# Patient Record
Sex: Male | Born: 1985 | Race: White | Hispanic: No | Marital: Single | Smoking: Current every day smoker
Health system: Southern US, Community
[De-identification: ages and names within clinical notes are randomized; demographics above are authoritative.]

## PROBLEM LIST (undated history)

## (undated) HISTORY — PX: TONSILLECTOMY: SUR1361

## (undated) HISTORY — PX: CYST REMOVAL NECK: SHX6281

---

## 2008-04-24 ENCOUNTER — Emergency Department: Payer: Self-pay | Admitting: Emergency Medicine

## 2008-05-20 ENCOUNTER — Emergency Department: Payer: Self-pay | Admitting: Unknown Physician Specialty

## 2011-11-25 ENCOUNTER — Emergency Department: Payer: Self-pay | Admitting: *Deleted

## 2013-05-23 ENCOUNTER — Emergency Department: Payer: Self-pay | Admitting: Emergency Medicine

## 2013-08-02 ENCOUNTER — Emergency Department: Payer: Self-pay | Admitting: Emergency Medicine

## 2013-11-23 ENCOUNTER — Emergency Department: Payer: Self-pay | Admitting: Emergency Medicine

## 2013-12-22 ENCOUNTER — Emergency Department: Payer: Self-pay | Admitting: Emergency Medicine

## 2014-01-19 ENCOUNTER — Emergency Department: Payer: Self-pay | Admitting: Emergency Medicine

## 2014-01-19 LAB — URINALYSIS, COMPLETE
Bacteria: NONE SEEN
Bilirubin,UR: NEGATIVE
Blood: NEGATIVE
Glucose,UR: NEGATIVE mg/dL (ref 0–75)
Ketone: NEGATIVE
Leukocyte Esterase: NEGATIVE
NITRITE: NEGATIVE
PROTEIN: NEGATIVE
Ph: 6 (ref 4.5–8.0)
RBC,UR: NONE SEEN /HPF (ref 0–5)
Specific Gravity: 1.018 (ref 1.003–1.030)
Squamous Epithelial: <1

## 2014-01-19 LAB — CBC WITH DIFFERENTIAL/PLATELET
Basophil #: 0.1 10*3/uL (ref 0.0–0.1)
Basophil %: 0.7 %
EOS ABS: 0.3 10*3/uL (ref 0.0–0.7)
Eosinophil %: 3.5 %
HCT: 45.3 % (ref 40.0–52.0)
HGB: 15 g/dL (ref 13.0–18.0)
Lymphocyte #: 2.5 10*3/uL (ref 1.0–3.6)
Lymphocyte %: 28.6 %
MCH: 29.9 pg (ref 26.0–34.0)
MCHC: 33.2 g/dL (ref 32.0–36.0)
MCV: 90 fL (ref 80–100)
Monocyte #: 0.6 x10 3/mm (ref 0.2–1.0)
Monocyte %: 6.4 %
Neutrophil #: 5.3 10*3/uL (ref 1.4–6.5)
Neutrophil %: 60.8 %
PLATELETS: 221 10*3/uL (ref 150–440)
RBC: 5.03 10*6/uL (ref 4.40–5.90)
RDW: 13.4 % (ref 11.5–14.5)
WBC: 8.8 10*3/uL (ref 3.8–10.6)

## 2014-01-19 LAB — COMPREHENSIVE METABOLIC PANEL
ALBUMIN: 4.4 g/dL (ref 3.4–5.0)
ANION GAP: 1 — AB (ref 7–16)
AST: 46 U/L — AB (ref 15–37)
Alkaline Phosphatase: 78 U/L
BUN: 7 mg/dL (ref 7–18)
Bilirubin,Total: 0.4 mg/dL (ref 0.2–1.0)
CHLORIDE: 108 mmol/L — AB (ref 98–107)
CO2: 30 mmol/L (ref 21–32)
Calcium, Total: 9.4 mg/dL (ref 8.5–10.1)
Creatinine: 1.13 mg/dL (ref 0.60–1.30)
EGFR (Non-African Amer.): >60
Glucose: 112 mg/dL — ABNORMAL HIGH (ref 65–99)
OSMOLALITY: 276 (ref 275–301)
Potassium: 3.7 mmol/L (ref 3.5–5.1)
SGPT (ALT): 69 U/L (ref 12–78)
Sodium: 139 mmol/L (ref 136–145)
TOTAL PROTEIN: 7.5 g/dL (ref 6.4–8.2)

## 2014-01-19 LAB — LIPASE, BLOOD: LIPASE: 684 U/L — AB (ref 73–393)

## 2014-01-26 ENCOUNTER — Emergency Department: Payer: Self-pay | Admitting: Emergency Medicine

## 2014-06-04 ENCOUNTER — Emergency Department: Payer: Self-pay | Admitting: Emergency Medicine

## 2014-06-19 ENCOUNTER — Emergency Department: Payer: Self-pay

## 2014-12-30 ENCOUNTER — Emergency Department: Payer: Self-pay | Admitting: Student

## 2014-12-30 LAB — DRUG SCREEN, URINE
AMPHETAMINES, UR SCREEN: NEGATIVE (ref ?–1000)
Barbiturates, Ur Screen: NEGATIVE (ref ?–200)
Benzodiazepine, Ur Scrn: POSITIVE (ref ?–200)
CANNABINOID 50 NG, UR ~~LOC~~: NEGATIVE (ref ?–50)
Cocaine Metabolite,Ur ~~LOC~~: POSITIVE (ref ?–300)
MDMA (Ecstasy)Ur Screen: NEGATIVE (ref ?–500)
METHADONE, UR SCREEN: NEGATIVE (ref ?–300)
OPIATE, UR SCREEN: NEGATIVE (ref ?–300)
PHENCYCLIDINE (PCP) UR S: NEGATIVE (ref ?–25)
Tricyclic, Ur Screen: NEGATIVE (ref ?–1000)

## 2014-12-30 LAB — URINALYSIS, COMPLETE
BLOOD: NEGATIVE
Bacteria: NONE SEEN
Bilirubin,UR: NEGATIVE
GLUCOSE, UR: NEGATIVE mg/dL (ref 0–75)
Ketone: NEGATIVE
Leukocyte Esterase: NEGATIVE
Nitrite: NEGATIVE
Ph: 6 (ref 4.5–8.0)
Protein: NEGATIVE
RBC,UR: 1 /HPF (ref 0–5)
SPECIFIC GRAVITY: 1.015 (ref 1.003–1.030)
Squamous Epithelial: <1
Transitional Epi: 4

## 2015-06-15 ENCOUNTER — Encounter: Payer: Self-pay | Admitting: Emergency Medicine

## 2015-06-15 ENCOUNTER — Emergency Department: Payer: Self-pay

## 2015-06-15 ENCOUNTER — Emergency Department
Admission: EM | Admit: 2015-06-15 | Discharge: 2015-06-15 | Disposition: A | Discharge status: Home / Self Care | Payer: Self-pay | Attending: Emergency Medicine | Admitting: Emergency Medicine

## 2015-06-15 DIAGNOSIS — Z720 Tobacco use: Secondary | ICD-10-CM | POA: Insufficient documentation

## 2015-06-15 DIAGNOSIS — N50811 Right testicular pain: Secondary | ICD-10-CM

## 2015-06-15 DIAGNOSIS — N433 Hydrocele, unspecified: Secondary | ICD-10-CM

## 2015-06-15 LAB — BASIC METABOLIC PANEL
Anion gap: 5 (ref 5–15)
BUN: 7 mg/dL (ref 6–20)
CALCIUM: 9.6 mg/dL (ref 8.9–10.3)
CO2: 28 mmol/L (ref 22–32)
Chloride: 108 mmol/L (ref 101–111)
Creatinine, Ser: 1.18 mg/dL (ref 0.61–1.24)
Glucose, Bld: 101 mg/dL — ABNORMAL HIGH (ref 65–99)
Potassium: 4.1 mmol/L (ref 3.5–5.1)
Sodium: 141 mmol/L (ref 135–145)

## 2015-06-15 LAB — CBC WITH DIFFERENTIAL/PLATELET
BASOS PCT: 2 %
Basophils Absolute: 0.2 10*3/uL — ABNORMAL HIGH (ref 0–0.1)
Eosinophils Absolute: 0.2 10*3/uL (ref 0–0.7)
Eosinophils Relative: 2 %
HCT: 44.5 % (ref 40.0–52.0)
Hemoglobin: 15.2 g/dL (ref 13.0–18.0)
LYMPHS ABS: 2 10*3/uL (ref 1.0–3.6)
Lymphocytes Relative: 24 %
MCH: 30.1 pg (ref 26.0–34.0)
MCHC: 34.1 g/dL (ref 32.0–36.0)
MCV: 88.1 fL (ref 80.0–100.0)
MONO ABS: 0.4 10*3/uL (ref 0.2–1.0)
Monocytes Relative: 5 %
Neutro Abs: 5.6 10*3/uL (ref 1.4–6.5)
Neutrophils Relative %: 67 %
Platelets: 192 10*3/uL (ref 150–440)
RBC: 5.05 MIL/uL (ref 4.40–5.90)
RDW: 13.5 % (ref 11.5–14.5)
WBC: 8.3 10*3/uL (ref 3.8–10.6)

## 2015-06-15 LAB — URINALYSIS COMPLETE WITH MICROSCOPIC (ARMC ONLY)
BACTERIA UA: NONE SEEN
Bilirubin Urine: NEGATIVE
Glucose, UA: NEGATIVE mg/dL
Ketones, ur: NEGATIVE mg/dL
Leukocytes, UA: NEGATIVE
NITRITE: NEGATIVE
Protein, ur: NEGATIVE mg/dL
SPECIFIC GRAVITY, URINE: 1.005 (ref 1.005–1.030)
SQUAMOUS EPITHELIAL / LPF: NONE SEEN
pH: 6 (ref 5.0–8.0)

## 2015-06-15 LAB — CHLAMYDIA/NGC RT PCR (ARMC ONLY)
Chlamydia Tr: NOT DETECTED
N GONORRHOEAE: NOT DETECTED

## 2015-06-15 MED ORDER — SODIUM CHLORIDE 0.9 % IV BOLUS (SEPSIS)
1000.0000 mL | Freq: Once | INTRAVENOUS | Status: AC
  Administered 2015-06-15: 1000 mL via INTRAVENOUS

## 2015-06-15 NOTE — ED Provider Notes (Signed)
[facility] Emergency Department Provider Note    ____________________________________________  Time seen: 1520  I have reviewed the triage vital signs and the nursing notes.   HISTORY  Chief Complaint Testicle Pain   History limited by: Not Limited   HPI Scott George is a 29 y.o. male who presents to the emergency department today because of concerns of right testicular pain. Patient states pain is been going on for roughly 2 weeks. He has intermittent episodes of sharp pain that is then turned into dull pain. He states that this dull pain than stays constant. He has had some associated nausea. He states he has not had any burning with his urination however does state he has noticed a foul odor to his urine. No discharge. He has not noticed any swelling to his testicles. No fevers.   History reviewed. No pertinent past medical history.  There are no active problems to display for this patient.   Past Surgical History  Procedure Laterality Date  . Cyst removal neck    . Tonsillectomy      No current outpatient prescriptions on file.  Allergies Review of patient's allergies indicates no known allergies.  No family history on file.  Social History History  Substance Use Topics  . Smoking status: Current Every Day Smoker -- 0.50 packs/day    Types: Cigarettes  . Smokeless tobacco: Not on file  . Alcohol Use: No    Review of Systems  Constitutional: Negative for fever. Cardiovascular: Negative for chest pain. Respiratory: Negative for shortness of breath. Gastrointestinal: Negative for abdominal pain, vomiting and diarrhea. Genitourinary: Negative for dysuria. Foul odor to his urine Musculoskeletal: Negative for back pain. Skin: Negative for rash. Neurological: Negative for headaches, focal weakness or numbness.   10-point ROS otherwise negative.  ____________________________________________   PHYSICAL EXAM:  VITAL  SIGNS: ED Triage Vitals  Enc Vitals Group     BP 06/15/15 1453 135/93 mmHg     Pulse Rate 06/15/15 1453 136     Resp 06/15/15 1453 20     Temp 06/15/15 1453 98.6 F (37 C)     Temp Source 06/15/15 1453 Oral     SpO2 06/15/15 1453 97 %     Weight 06/15/15 1453 200 lb (90.719 kg)     Height 06/15/15 1453 6\' 4"  (1.93 m)     Head Cir --      Peak Flow --      Pain Score 06/15/15 1453 8   Constitutional: Alert and oriented. Well appearing and in no distress. Eyes: Conjunctivae are normal. PERRL. Normal extraocular movements. ENT   Head: Normocephalic and atraumatic.   Nose: No congestion/rhinnorhea.   Mouth/Throat: Mucous membranes are moist.   Neck: No stridor. Hematological/Lymphatic/Immunilogical: No cervical lymphadenopathy. Cardiovascular: Normal rate, regular rhythm.  No murmurs, rubs, or gallops. Respiratory: Normal respiratory effort without tachypnea nor retractions. Breath sounds are clear and equal bilaterally. No wheezes/rales/rhonchi. Gastrointestinal: Soft and nontender. No distention. There is no CVA tenderness. Genitourinary: No swelling to bilateral testicles. Mild tenderness to palpation of the right testicle. Cremasteric intact. No penile swelling, lesions. Musculoskeletal: Normal range of motion in all extremities. No joint effusions.  No lower extremity tenderness nor edema. Neurologic:  Normal speech and language. No gross focal neurologic deficits are appreciated. Speech is normal.  Skin:  Skin is warm, dry and intact. No rash noted. Psychiatric: Mood and affect are normal. Speech and behavior are normal. Patient exhibits appropriate insight and judgment.  ____________________________________________  LABS (pertinent positives/negatives)  Labs Reviewed  URINALYSIS COMPLETEWITH MICROSCOPIC ([facility] ONLY) - Abnormal; Notable for the following:    Color, Urine STRAW (*)    APPearance CLEAR (*)    Hgb urine dipstick 2+ (*)    All other components  within normal limits  CBC WITH DIFFERENTIAL/PLATELET - Abnormal; Notable for the following:    Basophils Absolute 0.2 (*)    All other components within normal limits  BASIC METABOLIC PANEL - Abnormal; Notable for the following:    Glucose, Bld 101 (*)    All other components within normal limits  CHLAMYDIA/NGC RT PCR ([facility] ONLY)  HIV ANTIBODY (ROUTINE TESTING)    ____________________________________________   EKG  None  ____________________________________________    RADIOLOGY  US scrotal IMPRESSION: Small BILATERAL hydroceles.  Otherwise normal exam.  ____________________________________________   PROCEDURES  Procedure(s) performed: None  Critical Care performed: No  ____________________________________________   INITIAL IMPRESSION / ASSESSMENT AND PLAN / ED COURSE  Pertinent labs & imaging results that were available during my care of the patient were reviewed by me and considered in my medical decision making (see chart for details).  Patient presents to the emergency department with right testicular pain. Ultrasound showed good flow to bilateral testicles. Small hydroceles. Blood work without any leukocytosis. No obvious cause of etiology pain based either on workup or physical exam. Did discuss with patient hydroceles. Will give patient urology follow-up.  ____________________________________________   FINAL CLINICAL IMPRESSION(S) / ED DIAGNOSES  Final diagnoses:  Right testicular pain  Hydrocele, unspecified hydrocele type     Phineas Semen, MD 06/15/15 2044

## 2015-06-15 NOTE — ED Notes (Signed)
Pt reports testicle and lower pelvis pain x1-2 weeks; pt denies any discharge from penis, denies difficulty urinating. Pt reports odor to urine.

## 2015-06-15 NOTE — Discharge Instructions (Signed)
Please seek medical attention for any high fevers, chest pain, shortness of breath, change in behavior, persistent vomiting, bloody stool or any other new or concerning symptoms. ° °Hydrocele, Adult °Fluid can collect around the testicles. This fluid forms in a sac. This condition is called a hydrocele. The collected fluid causes swelling of the scrotum. Usually, it affects just one testicle. Most of the time, the condition does not cause pain. Sometimes, the hydrocele goes away on its own. Other times, surgery is needed to get rid of the fluid. °CAUSES °A hydrocele does not develop often. Different things can cause a hydrocele in a man, including: °· Injury to the scrotum. °· Infection. °· X-ray of the area around the scrotum. °· A tumor or cancer of the testicle. °· Twisting of a testicle. °· Decreased blood flow to the scrotum. °SYMPTOMS  °· Swelling without pain. The hydrocele feels like a water-filled balloon. °· Swelling with pain. This can occur if the hydrocele was caused by infection or twisting. °· Mild discomfort in the scrotum. °· The hydrocele may feel heavy. °· Swelling that gets smaller when you lie down. °DIAGNOSIS  °Your caregiver will do a physical exam to decide if you have a hydrocele. This may include: °· Asking questions about your overall health, today and in the past. Your caregiver may ask about any injuries, X-rays, or infections. °· Pushing on your abdomen or asking you to change positions to see if the size of the hydrocele changes. °· Shining a light through the scrotum (transillumination) to see if the fluid inside the scrotum is clear. °· Blood tests and urine tests to check for infection. °· Imaging studies that take pictures of the scrotum and testicles. °TREATMENT  °Treatment depends in part on what caused the condition. Options include: °· Watchful waiting. Your caregiver checks the hydrocele every so often. °· Different surgeries to drain the fluid. °¨ A needle may be put into the  scrotum to drain fluid (needle aspiration). Fluid often returns after this type of treatment. °¨ A cut (incision) may be made in the scrotum to remove the fluid sac (hydrocelectomy). °¨ An incision may be made in the groin to repair a hydrocele that has contact with abdominal fluids (communicating hydrocele). °· Medicines to treat an infection (antibiotics). °HOME CARE INSTRUCTIONS  °What you need to do at home may depend on the cause of the hydrocele and type of treatment. In general: °· Take all medicine as directed by your caregiver. Follow the directions carefully. °· Ask your caregiver if there is anything you should not do while you recover (activities, lifting, work, sex). °· If you had surgery to repair a communicating hydrocele, recovery time may vary. Ask you caregiver about your recovery time. °· Avoid heavy lifting for 4 to 6 weeks. °· If you had an incision on the scrotum or groin, wash it for 2 to 3 days after surgery. Do this as long as the skin is closed and there are no gaps in the wound. Wash gently, and avoid rubbing the incision. °· Keep all follow-up appointments. °SEEK MEDICAL CARE IF:  °· Your scrotum seems to be getting larger. °· The area becomes more and more uncomfortable. °SEEK IMMEDIATE MEDICAL CARE IF:  °You have a fever. °Document Released: 05/03/2010 Document Revised: 09/03/2013 Document Reviewed: 05/03/2010 °ExitCare® Patient Information ©2015 [employer]. This information is not intended to replace advice given to you by your health care provider. Make sure you discuss any questions you have with your health   care provider. ° °

## 2015-06-16 LAB — HIV ANTIBODY (ROUTINE TESTING W REFLEX): HIV SCREEN 4TH GENERATION: NONREACTIVE

## 2015-08-04 ENCOUNTER — Encounter: Payer: Self-pay | Admitting: Emergency Medicine

## 2015-08-04 ENCOUNTER — Emergency Department
Admission: EM | Admit: 2015-08-04 | Discharge: 2015-08-04 | Disposition: A | Discharge status: Home / Self Care | Payer: Self-pay | Attending: Emergency Medicine | Admitting: Emergency Medicine

## 2015-08-04 DIAGNOSIS — Z720 Tobacco use: Secondary | ICD-10-CM | POA: Insufficient documentation

## 2015-08-04 DIAGNOSIS — K644 Residual hemorrhoidal skin tags: Secondary | ICD-10-CM | POA: Insufficient documentation

## 2015-08-04 LAB — COMPREHENSIVE METABOLIC PANEL
ALT: 17 U/L (ref 17–63)
ANION GAP: 8 (ref 5–15)
AST: 19 U/L (ref 15–41)
Albumin: 4.8 g/dL (ref 3.5–5.0)
Alkaline Phosphatase: 72 U/L (ref 38–126)
BUN: 12 mg/dL (ref 6–20)
CO2: 26 mmol/L (ref 22–32)
Calcium: 10.1 mg/dL (ref 8.9–10.3)
Chloride: 108 mmol/L (ref 101–111)
Creatinine, Ser: 1.27 mg/dL — ABNORMAL HIGH (ref 0.61–1.24)
GFR calc non Af Amer: >60 mL/min (ref >60)
Glucose, Bld: 103 mg/dL — ABNORMAL HIGH (ref 65–99)
Potassium: 3.8 mmol/L (ref 3.5–5.1)
Sodium: 142 mmol/L (ref 135–145)
Total Bilirubin: 0.8 mg/dL (ref 0.3–1.2)
Total Protein: 7.7 g/dL (ref 6.5–8.1)

## 2015-08-04 LAB — CBC
HCT: 43.3 % (ref 40.0–52.0)
Hemoglobin: 14.6 g/dL (ref 13.0–18.0)
MCH: 29.8 pg (ref 26.0–34.0)
MCHC: 33.8 g/dL (ref 32.0–36.0)
MCV: 88.2 fL (ref 80.0–100.0)
Platelets: 219 10*3/uL (ref 150–440)
RBC: 4.91 MIL/uL (ref 4.40–5.90)
RDW: 12.9 % (ref 11.5–14.5)
WBC: 6.8 10*3/uL (ref 3.8–10.6)

## 2015-08-04 MED ORDER — DOCUSATE SODIUM 100 MG PO CAPS
100.0000 mg | ORAL_CAPSULE | Freq: Every day | ORAL | Status: AC | PRN
Start: 2015-08-04 — End: 2016-08-03

## 2015-08-04 MED ORDER — HYDROCORTISONE 2.5 % RE CREA: 1.0000 "application " | TOPICAL_CREAM | Freq: Two times a day (BID) | RECTAL | Status: DC

## 2015-08-04 NOTE — Discharge Instructions (Signed)

## 2015-08-04 NOTE — ED Provider Notes (Addendum)
[facility] Emergency Department Provider Note     Time seen: ----------------------------------------- 2:34 PM on 08/04/2015 -----------------------------------------    I have reviewed the triage vital signs and the nursing notes.   HISTORY  Chief Complaint Rectal Bleeding    HPI Scott George is a 29 y.o. male who presents ER for rectal bleeding started about 2 weeks ago. Patient states he noticed more blood today than normal. He doesn't think his lost a couple blood, has not this happen before. He is having painful bright red blood per rectum. He reports decreased appetite.   No past medical history on file.  There are no active problems to display for this patient.   Past Surgical History  Procedure Laterality Date  . Cyst removal neck    . Tonsillectomy      Allergies Review of patient's allergies indicates no known allergies.  Social History Social History  Substance Use Topics  . Smoking status: Current Every Day Smoker -- 0.50 packs/day    Types: Cigarettes  . Smokeless tobacco: None  . Alcohol Use: No    Review of Systems Constitutional: Negative for fever. Eyes: Negative for visual changes. ENT: Negative for sore throat. Cardiovascular: Negative for chest pain. Respiratory: Negative for shortness of breath. Gastrointestinal: Negative for abdominal pain, vomiting and diarrhea. Patient notes decreased bowel movements, bright rectal bleeding with pain Genitourinary: Negative for dysuria. Musculoskeletal: Negative for back pain. Skin: Negative for rash. Neurological: Negative for headaches, focal weakness or numbness.  10-point ROS otherwise negative.  ____________________________________________   PHYSICAL EXAM:  VITAL SIGNS: ED Triage Vitals  Enc Vitals Group     BP 08/04/15 1322 158/93 mmHg     Pulse Rate 08/04/15 1322 90     Resp 08/04/15 1322 18     Temp 08/04/15 1322 98.2 F (36.8 C)     Temp Source  08/04/15 1322 Oral     SpO2 08/04/15 1322 98 %     Weight 08/04/15 1322 205 lb (92.987 kg)     Height 08/04/15 1322  (1.905 m)     Head Cir --      Peak Flow --      Pain Score 08/04/15 1328 4     Pain Loc --      Pain Edu? --      Excl. in GC? --     Constitutional: Alert and oriented. Well appearing and in no distress. Eyes: Conjunctivae are normal. PERRL. Normal extraocular movements. ENT   Head: Normocephalic and atraumatic.   Nose: No congestion/rhinnorhea.   Mouth/Throat: Mucous membranes are moist.   Neck: No stridor. Cardiovascular: Normal rate, regular rhythm. Normal and symmetric distal pulses are present in all extremities. No murmurs, rubs, or gallops. Respiratory: Normal respiratory effort without tachypnea nor retractions. Breath sounds are clear and equal bilaterally. No wheezes/rales/rhonchi. Gastrointestinal: Soft and nontender. No distention. No abdominal bruits.  Rectal: There is a tender bleeding external hemorrhoid on the patient's left. Musculoskeletal: Nontender with normal range of motion in all extremities. No joint effusions.  No lower extremity tenderness nor edema. Neurologic:  Normal speech and language. No gross focal neurologic deficits are appreciated. Speech is normal. No gait instability. Skin:  Skin is warm, dry and intact. No rash noted. Psychiatric: Mood and affect are normal. Speech and behavior are normal. Patient exhibits appropriate insight and judgment.  ____________________________________________  ED COURSE:  Pertinent labs & imaging results that were available during my care of the patient were reviewed by  me and considered in my medical decision making (see chart for details). Patient with a tender external hemorrhoid that is bleeding. It does not appear thrombosed at this time. We'll check basic labs and likely be able to discharge him with some steroid rectal cream ____________________________________________     LABS (pertinent positives/negatives)  Labs Reviewed  COMPREHENSIVE METABOLIC PANEL - Abnormal; Notable for the following:    Glucose, Bld 103 (*)    Creatinine, Ser 1.27 (*)    All other components within normal limits  CBC  POC OCCULT BLOOD, ED  TYPE AND SCREEN   ____________________________________________  FINAL ASSESSMENT AND PLAN  External hemorrhoid  Plan: Patient with labs and imaging as dictated above. Patient with a bleeding external hemorrhoid, we discharged with Anusol HC and colace. He stable for discharge and follow-up with GI if not improving   Emily Filbert, MD   Emily Filbert, MD 08/04/15 1436  Emily Filbert, MD 08/04/15 [phone]

## 2015-08-04 NOTE — ED Notes (Signed)
Pt reports rectal bleeding that started bout 2 weeks ago.  Denies abdominal pain.  Reports decreased appetite.

## 2015-10-22 ENCOUNTER — Emergency Department
Admission: EM | Admit: 2015-10-22 | Discharge: 2015-10-22 | Disposition: A | Discharge status: Home / Self Care | Payer: Self-pay | Attending: Emergency Medicine | Admitting: Emergency Medicine

## 2015-10-22 ENCOUNTER — Encounter: Payer: Self-pay | Admitting: Emergency Medicine

## 2015-10-22 DIAGNOSIS — L509 Urticaria, unspecified: Secondary | ICD-10-CM | POA: Insufficient documentation

## 2015-10-22 DIAGNOSIS — Z7952 Long term (current) use of systemic steroids: Secondary | ICD-10-CM | POA: Insufficient documentation

## 2015-10-22 DIAGNOSIS — F17210 Nicotine dependence, cigarettes, uncomplicated: Secondary | ICD-10-CM | POA: Insufficient documentation

## 2015-10-22 DIAGNOSIS — F419 Anxiety disorder, unspecified: Secondary | ICD-10-CM | POA: Insufficient documentation

## 2015-10-22 MED ORDER — DIPHENHYDRAMINE HCL 25 MG PO CAPS
25.0000 mg | ORAL_CAPSULE | Freq: Once | ORAL | Status: AC
  Administered 2015-10-22: 25 mg via ORAL
  Filled 2015-10-22: qty 1

## 2015-10-22 MED ORDER — PREDNISONE 50 MG PO TABS: 50.0000 mg | ORAL_TABLET | Freq: Every day | ORAL | Status: DC

## 2015-10-22 MED ORDER — PREDNISONE 20 MG PO TABS
60.0000 mg | ORAL_TABLET | Freq: Once | ORAL | Status: AC
  Administered 2015-10-22: 60 mg via ORAL
  Filled 2015-10-22: qty 3

## 2015-10-22 MED ORDER — DIPHENHYDRAMINE HCL 25 MG PO CAPS: 25.0000 mg | ORAL_CAPSULE | ORAL | Status: DC | PRN

## 2015-10-22 NOTE — ED Provider Notes (Signed)
[facility] Emergency Department Provider Note  ____________________________________________  Time seen: On arrival  I have reviewed the triage vital signs and the nursing notes.   HISTORY  Chief Complaint Allergic reaction   HPI Scott George is a 29 y.o. male who presents with complaints of hives and itching. He is not entirely clear what caused this but reports that he developed itching first on his back and lower extremities and in his arms. He denies throat swelling difficulty swallowing or difficulty breathing. Does report a history of hives in the past.     No past medical history on file.  There are no active problems to display for this patient.   Past Surgical History  Procedure Laterality Date  . Cyst removal neck    . Tonsillectomy      Current Outpatient Rx  Name  Route  Sig  Dispense  Refill  . docusate sodium (COLACE) 100 MG capsule   Oral   Take 1 capsule (100 mg total) by mouth daily as needed.   30 capsule   2   . hydrocortisone (ANUSOL-HC) 2.5 % rectal cream   Rectal   Place 1 application rectally 2 (two) times daily.   30 g   0     Allergies Review of patient's allergies indicates no known allergies.  No family history on file.  Social History Social History  Substance Use Topics  . Smoking status: Current Every Day Smoker -- 0.50 packs/day    Types: Cigarettes  . Smokeless tobacco: Not on file  . Alcohol Use: No    Review of Systems  Constitutional: Negative for fever. Eyes: Negative for visual changes. ENT: Negative for sore throat or swelling Cardiovascular: Negative for chest pain. Respiratory: Negative for shortness of breath. Gastrointestinal: Negative for abdominal pain, vomiting and diarrhea.  Musculoskeletal: Negative for joint pain or swelling Skin: Positive for rash Neurological: Negative for headaches  Psychiatric: Mild  anxiety    ____________________________________________   PHYSICAL EXAM:  VITAL SIGNS: ED Triage Vitals  Enc Vitals Group     BP 10/22/15 2112 132/69 mmHg     Pulse Rate 10/22/15 2112 63     Resp 10/22/15 2112 18     Temp 10/22/15 2112 97.7 F (36.5 C)     Temp Source 10/22/15 2112 Oral     SpO2 10/22/15 2112 97 %     Weight --      Height --      Head Cir --      Peak Flow --      Pain Score 10/22/15 2114 5     Pain Loc --      Pain Edu? --      Excl. in GC? --      Constitutional: Alert and oriented. Well appearing and in no distress. Eyes: Conjunctivae are normal.  ENT   Head: Normocephalic and atraumatic.   Mouth/Throat: Mucous membranes are moist. Pharynx is normal Cardiovascular: Normal rate, regular rhythm. Normal and symmetric distal pulses are present in all extremities.  Respiratory: Normal respiratory effort without tachypnea nor retractions. Breath sounds are clear and equal bilaterally. There is no stridor Gastrointestinal: Soft and non-tender in all quadrants. No distention. There is no CVA tenderness. Genitourinary: deferred Musculoskeletal: Nontender with normal range of motion in all extremities. No lower extremity tenderness nor edema. Neurologic:  Normal speech and language. No gross focal neurologic deficits are appreciated. Skin:  Skin is warm, dry and intact. Scattered hives noted primarily on  the back Psychiatric: Mood and affect are normal. Patient exhibits appropriate insight and judgment.  ____________________________________________    LABS (pertinent positives/negatives)  Labs Reviewed - No data to display  ____________________________________________   EKG    ____________________________________________    RADIOLOGY I have personally reviewed any xrays that were ordered on this patient: None  ____________________________________________   PROCEDURES  Procedure(s) performed: none  Critical Care performed:  none  ____________________________________________   INITIAL IMPRESSION / ASSESSMENT AND PLAN / ED COURSE  Pertinent labs & imaging results that were available during my care of the patient were reviewed by me and considered in my medical decision making (see chart for details).  Patient with evidence of allergic reaction manifested by urticaria. No intraoral swelling or evidence of anaphylaxis. Treated with Benadryl by mouth and prednisone.   After observation in the emergency department patient's symptoms completely resolved. We'll discharge with prednisone Rx and Benadryl and instructions to return if any worsening of symptoms    ____________________________________________   FINAL CLINICAL IMPRESSION(S) / ED DIAGNOSES  Final diagnoses:  Hives     Jene Every, MD 10/22/15 2239

## 2015-10-22 NOTE — Discharge Instructions (Signed)
Hives Hives are itchy, red, swollen areas of the skin. They can vary in size and location on your body. Hives can come and go for hours or several days (acute hives) or for several weeks (chronic hives). Hives do not spread from person to person (noncontagious). They may get worse with scratching, exercise, and emotional stress. CAUSES   Allergic reaction to food, additives, or drugs.  Infections, including the common cold.  Illness, such as vasculitis, lupus, or thyroid disease.  Exposure to sunlight, heat, or cold.  Exercise.  Stress.  Contact with chemicals. SYMPTOMS   Red or white swollen patches on the skin. The patches may change size, shape, and location quickly and repeatedly.  Itching.  Swelling of the hands, feet, and face. This may occur if hives develop deeper in the skin. DIAGNOSIS  Your caregiver can usually tell what is wrong by performing a physical exam. Skin or blood tests may also be done to determine the cause of your hives. In some cases, the cause cannot be determined. TREATMENT  Mild cases usually get better with medicines such as antihistamines. Severe cases may require an emergency epinephrine injection. If the cause of your hives is known, treatment includes avoiding that trigger.  HOME CARE INSTRUCTIONS   Avoid causes that trigger your hives.  Take antihistamines as directed by your caregiver to reduce the severity of your hives. Non-sedating or low-sedating antihistamines are usually recommended. Do not drive while taking an antihistamine.  Take any other medicines prescribed for itching as directed by your caregiver.  Wear loose-fitting clothing.  Keep all follow-up appointments as directed by your caregiver. SEEK MEDICAL CARE IF:   You have persistent or severe itching that is not relieved with medicine.  You have painful or swollen joints. SEEK IMMEDIATE MEDICAL CARE IF:   You have a fever.  Your tongue or lips are swollen.  You have  trouble breathing or swallowing.  You feel tightness in the throat or chest.  You have abdominal pain. These problems may be the first sign of a life-threatening allergic reaction. Call your local emergency services (911 in [place] MAKE SURE YOU:   Understand these instructions.  Will watch your condition.  Will get help right away if you are not doing well or get worse.   This information is not intended to replace advice given to you by your health care provider. Make sure you discuss any questions you have with your health care provider.   Document Released: 11/13/2005 Document Revised: 11/18/2013 Document Reviewed: 02/06/2012 Elsevier Interactive Patient Education 2016 [employer]  

## 2015-10-22 NOTE — ED Notes (Signed)
Pt d/c to home with significant other. Pt is alert, ambulatory, with NAD noted at this time.

## 2015-10-22 NOTE — ED Notes (Signed)
Per pt he was "playing with the beads in the bag" in the pill bottle. They got on his skin and caused a reaction.

## 2015-11-04 ENCOUNTER — Emergency Department: Payer: Self-pay

## 2015-11-04 ENCOUNTER — Emergency Department
Admission: EM | Admit: 2015-11-04 | Discharge: 2015-11-04 | Disposition: A | Discharge status: Home / Self Care | Payer: Self-pay | Attending: Emergency Medicine | Admitting: Emergency Medicine

## 2015-11-04 ENCOUNTER — Encounter: Payer: Self-pay | Admitting: Emergency Medicine

## 2015-11-04 DIAGNOSIS — N200 Calculus of kidney: Secondary | ICD-10-CM

## 2015-11-04 DIAGNOSIS — R109 Unspecified abdominal pain: Secondary | ICD-10-CM

## 2015-11-04 DIAGNOSIS — F17210 Nicotine dependence, cigarettes, uncomplicated: Secondary | ICD-10-CM | POA: Insufficient documentation

## 2015-11-04 DIAGNOSIS — Z7952 Long term (current) use of systemic steroids: Secondary | ICD-10-CM | POA: Insufficient documentation

## 2015-11-04 DIAGNOSIS — N201 Calculus of ureter: Secondary | ICD-10-CM | POA: Insufficient documentation

## 2015-11-04 DIAGNOSIS — Z79899 Other long term (current) drug therapy: Secondary | ICD-10-CM | POA: Insufficient documentation

## 2015-11-04 LAB — COMPREHENSIVE METABOLIC PANEL
ALT: 15 U/L — AB (ref 17–63)
AST: 17 U/L (ref 15–41)
Albumin: 4.3 g/dL (ref 3.5–5.0)
Alkaline Phosphatase: 64 U/L (ref 38–126)
Anion gap: 5 (ref 5–15)
BUN: 15 mg/dL (ref 6–20)
CHLORIDE: 108 mmol/L (ref 101–111)
CO2: 27 mmol/L (ref 22–32)
Calcium: 9.6 mg/dL (ref 8.9–10.3)
Creatinine, Ser: 1.18 mg/dL (ref 0.61–1.24)
GFR calc Af Amer: >60 mL/min (ref >60)
GFR calc non Af Amer: >60 mL/min (ref >60)
Glucose, Bld: 93 mg/dL (ref 65–99)
Potassium: 4 mmol/L (ref 3.5–5.1)
SODIUM: 140 mmol/L (ref 135–145)
Total Bilirubin: 0.3 mg/dL (ref 0.3–1.2)
Total Protein: 7.2 g/dL (ref 6.5–8.1)

## 2015-11-04 LAB — URINALYSIS COMPLETE WITH MICROSCOPIC (ARMC ONLY)
Bacteria, UA: NONE SEEN
Bilirubin Urine: NEGATIVE
Glucose, UA: NEGATIVE mg/dL
Leukocytes, UA: NEGATIVE
Nitrite: NEGATIVE
PROTEIN: 30 mg/dL — AB
SPECIFIC GRAVITY, URINE: 1.03 (ref 1.005–1.030)
pH: 5 (ref 5.0–8.0)

## 2015-11-04 LAB — CBC
HEMATOCRIT: 44.7 % (ref 40.0–52.0)
HEMOGLOBIN: 14.6 g/dL (ref 13.0–18.0)
MCH: 28.9 pg (ref 26.0–34.0)
MCHC: 32.6 g/dL (ref 32.0–36.0)
MCV: 88.5 fL (ref 80.0–100.0)
Platelets: 201 10*3/uL (ref 150–440)
RBC: 5.05 MIL/uL (ref 4.40–5.90)
RDW: 12.8 % (ref 11.5–14.5)
WBC: 6.8 10*3/uL (ref 3.8–10.6)

## 2015-11-04 LAB — LIPASE, BLOOD: LIPASE: 24 U/L (ref 11–51)

## 2015-11-04 MED ORDER — MORPHINE SULFATE (PF) 4 MG/ML IV SOLN
4.0000 mg | Freq: Once | INTRAVENOUS | Status: AC
  Administered 2015-11-04: 4 mg via INTRAVENOUS
  Filled 2015-11-04: qty 1

## 2015-11-04 MED ORDER — ONDANSETRON HCL 4 MG/2ML IJ SOLN
4.0000 mg | Freq: Once | INTRAMUSCULAR | Status: AC | PRN
  Administered 2015-11-04: 4 mg via INTRAVENOUS
  Filled 2015-11-04: qty 2

## 2015-11-04 MED ORDER — OXYCODONE-ACETAMINOPHEN 5-325 MG PO TABS: 1.0000 | ORAL_TABLET | Freq: Four times a day (QID) | ORAL | Status: DC | PRN

## 2015-11-04 MED ORDER — TAMSULOSIN HCL 0.4 MG PO CAPS: 0.4000 mg | ORAL_CAPSULE | Freq: Every day | ORAL | Status: DC

## 2015-11-04 MED ORDER — OXYCODONE-ACETAMINOPHEN 5-325 MG PO TABS
ORAL_TABLET | ORAL | Status: DC
Start: 2015-11-04 — End: 2015-11-04
  Filled 2015-11-04: qty 1

## 2015-11-04 MED ORDER — TAMSULOSIN HCL 0.4 MG PO CAPS
0.4000 mg | ORAL_CAPSULE | Freq: Once | ORAL | Status: AC
  Administered 2015-11-04: 0.4 mg via ORAL
  Filled 2015-11-04: qty 1

## 2015-11-04 MED ORDER — OXYCODONE-ACETAMINOPHEN 5-325 MG PO TABS
1.0000 | ORAL_TABLET | Freq: Once | ORAL | Status: AC
  Administered 2015-11-04: 1 via ORAL

## 2015-11-04 NOTE — ED Notes (Signed)
Bladder scanner reading 16ml; pt says he feels like he needs to void but "nothing comes out";

## 2015-11-04 NOTE — ED Notes (Signed)
Pt declined IV at this time for pain medications; says he doesn't like needles; would like oral pain medication instead

## 2015-11-04 NOTE — ED Notes (Signed)
Pt arrived via EMS with c/o left back and flank pain; urinary retention; pt says he has not voided since last night and currently is unable to; no history of urinary retention or kidney stones; pt restless in triage; tearful

## 2015-11-04 NOTE — Discharge Instructions (Signed)
Kidney Stones °Kidney stones (urolithiasis) are deposits that form inside your kidneys. The intense pain is caused by the stone moving through the urinary tract. When the stone moves, the ureter goes into spasm around the stone. The stone is usually passed in the urine.  °CAUSES  °· A disorder that makes certain neck glands produce too much parathyroid hormone (primary hyperparathyroidism). °· A buildup of uric acid crystals, similar to gout in your joints. °· Narrowing (stricture) of the ureter. °· A kidney obstruction present at birth (congenital obstruction). °· Previous surgery on the kidney or ureters. °· Numerous kidney infections. °SYMPTOMS  °· Feeling sick to your stomach (nauseous). °· Throwing up (vomiting). °· Blood in the urine (hematuria). °· Pain that usually spreads (radiates) to the groin. °· Frequency or urgency of urination. °DIAGNOSIS  °· Taking a history and physical exam. °· Blood or urine tests. °· CT scan. °· Occasionally, an examination of the inside of the urinary bladder (cystoscopy) is performed. °TREATMENT  °· Observation. °· Increasing your fluid intake. °· Extracorporeal shock wave lithotripsy--This is a noninvasive procedure that uses shock waves to break up kidney stones. °· Surgery may be needed if you have severe pain or persistent obstruction. There are various surgical procedures. Most of the procedures are performed with the use of small instruments. Only small incisions are needed to accommodate these instruments, so recovery time is minimized. °The size, location, and chemical composition are all important variables that will determine the proper choice of action for you. Talk to your health care provider to better understand your situation so that you will minimize the risk of injury to yourself and your kidney.  °HOME CARE INSTRUCTIONS  °· Drink enough water and fluids to keep your urine clear or pale yellow. This will help you to pass the stone or stone fragments. °· Strain  all urine through the provided strainer. Keep all particulate matter and stones for your health care provider to see. The stone causing the pain may be as small as a grain of salt. It is very important to use the strainer each and every time you pass your urine. The collection of your stone will allow your health care provider to analyze it and verify that a stone has actually passed. The stone analysis will often identify what you can do to reduce the incidence of recurrences. °· Only take over-the-counter or prescription medicines for pain, discomfort, or fever as directed by your health care provider. °· Keep all follow-up visits as told by your health care provider. This is important. °· Get follow-up X-rays if required. The absence of pain does not always mean that the stone has passed. It may have only stopped moving. If the urine remains completely obstructed, it can cause loss of kidney function or even complete destruction of the kidney. It is your responsibility to make sure X-rays and follow-ups are completed. Ultrasounds of the kidney can show blockages and the status of the kidney. Ultrasounds are not associated with any radiation and can be performed easily in a matter of minutes. °· Make changes to your daily diet as told by your health care provider. You may be told to: °¨ Limit the amount of salt that you eat. °¨ Eat 5 or more servings of fruits and vegetables each day. °¨ Limit the amount of meat, poultry, fish, and eggs that you eat. °· Collect a 24-hour urine sample as told by your health care provider. You may need to collect another urine sample every 6-12   months. °SEEK MEDICAL CARE IF: °· You experience pain that is progressive and unresponsive to any pain medicine you have been prescribed. °SEEK IMMEDIATE MEDICAL CARE IF:  °· Pain cannot be controlled with the prescribed medicine. °· You have a fever or shaking chills. °· The severity or intensity of pain increases over 18 hours and is not  relieved by pain medicine. °· You develop a new onset of abdominal pain. °· You feel faint or pass out. °· You are unable to urinate. °  °This information is not intended to replace advice given to you by your health care provider. Make sure you discuss any questions you have with your health care provider. °  °Document Released: 11/13/2005 Document Revised: 08/04/2015 Document Reviewed: 04/16/2013 °Elsevier Interactive Patient Education ©2016 [employer] ° °

## 2015-11-04 NOTE — ED Provider Notes (Signed)
[facility] Emergency Department Provider Note  ____________________________________________  Time seen: Approximately 130 PM  I have reviewed the triage vital signs and the nursing notes.   HISTORY  Chief Complaint Back Pain; Flank Pain; and Urinary Retention    HPI Scott George is a 29 y.o. male without any chronic medical conditions who is presenting today with left-sided flank and back pain. He says he also has not been able to urinate since last night. He says he is to void but nothing is coming out. He says that the pain is sharp and now constant. He said he had similar pain several days ago but it abated spontaneously. Has never had a kidney stone or any history of kidney stones. Denies any blood in his urine. Says he has been nauseous but without vomiting. He says the pain at this time is a 10 out of 10.   History reviewed. No pertinent past medical history.  There are no active problems to display for this patient.   Past Surgical History  Procedure Laterality Date  . Cyst removal neck    . Tonsillectomy      Current Outpatient Rx  Name  Route  Sig  Dispense  Refill  . diphenhydrAMINE (BENADRYL) 25 mg capsule   Oral   Take 1 capsule (25 mg total) by mouth every 4 (four) hours as needed. Patient taking differently: Take 25 mg by mouth every 4 (four) hours as needed for itching or allergies.    30 capsule   2   . ibuprofen (ADVIL,MOTRIN) 200 MG tablet   Oral   Take 200 mg by mouth every 6 (six) hours as needed for mild pain or moderate pain.         Marland Kitchen. docusate sodium (COLACE) 100 MG capsule   Oral   Take 1 capsule (100 mg total) by mouth daily as needed. Patient not taking: Reported on 11/04/2015   30 capsule   2   . hydrocortisone (ANUSOL-HC) 2.5 % rectal cream   Rectal   Place 1 application rectally 2 (two) times daily. Patient not taking: Reported on 11/04/2015   30 g   0   . predniSONE (DELTASONE) 50 MG tablet   Oral  Take 1 tablet (50 mg total) by mouth daily with breakfast. Patient not taking: Reported on 11/04/2015   5 tablet   0     Allergies Review of patient's allergies indicates no known allergies.  History reviewed. No pertinent family history.  Social History Social History  Substance Use Topics  . Smoking status: Current Every Day Smoker -- 0.50 packs/day    Types: Cigarettes  . Smokeless tobacco: None  . Alcohol Use: No    Review of Systems Constitutional: No fever/chills Eyes: No visual changes. ENT: No sore throat. Cardiovascular: Denies chest pain. Respiratory: Denies shortness of breath. Gastrointestinal: No abdominal pain.  No nausea, no vomiting.  No diarrhea.  No constipation. Genitourinary: As above  Musculoskeletal: Left flank and back pain  Skin: Negative for rash. Neurological: Negative for headaches, focal weakness or numbness.  10-point ROS otherwise negative.  ____________________________________________   PHYSICAL EXAM:  VITAL SIGNS: ED Triage Vitals  Enc Vitals Group     BP 11/04/15 1211 147/92 mmHg     Pulse Rate 11/04/15 1211 84     Resp 11/04/15 1211 18     Temp 11/04/15 1211 97.8 F (36.6 C)     Temp Source 11/04/15 1211 Oral     SpO2 11/04/15  1211 98 %     Weight 11/04/15 1211 200 lb (90.719 kg)     Height 11/04/15 1211  (1.905 m)     Head Cir --      Peak Flow --      Pain Score 11/04/15 1211 10     Pain Loc --      Pain Edu? --      Excl. in GC? --     Constitutional: Alert and oriented. Visibly uncomfortable and tearful.  Eyes: Conjunctivae are normal. PERRL. EOMI. Head: Atraumatic. Nose: No congestion/rhinnorhea. Mouth/Throat: Mucous membranes are moist.   Neck: No stridor.   Cardiovascular: Normal rate, regular rhythm. Grossly normal heart sounds.  Good peripheral circulation. Respiratory: Normal respiratory effort.  No retractions. Lungs CTAB. Gastrointestinal: Diffuse tenderness without any rebound or guarding. No  distention. No abdominal bruits. Left-sided CVA tenderness  Musculoskeletal: No lower extremity tenderness nor edema.  No joint effusions. Neurologic:  Normal speech and language. No gross focal neurologic deficits are appreciated. No gait instability. Skin:  Skin is warm, dry and intact. No rash noted. Psychiatric: Mood and affect are normal. Speech and behavior are normal.  ____________________________________________   LABS (all labs ordered are listed, but only abnormal results are displayed)  Labs Reviewed  URINALYSIS COMPLETEWITH MICROSCOPIC ([facility] ONLY) - Abnormal; Notable for the following:    Color, Urine YELLOW (*)    APPearance CLEAR (*)    Ketones, ur TRACE (*)    Hgb urine dipstick 2+ (*)    Protein, ur 30 (*)    Squamous Epithelial / LPF 0-5 (*)    All other components within normal limits  COMPREHENSIVE METABOLIC PANEL - Abnormal; Notable for the following:    ALT 15 (*)    All other components within normal limits  LIPASE, BLOOD  CBC   ____________________________________________  EKG   ____________________________________________  RADIOLOGY  Left 2 mm UVJ calculus with mild Hydro. Small bilateral renal calculi. ____________________________________________   PROCEDURES   ____________________________________________   INITIAL IMPRESSION / ASSESSMENT AND PLAN / ED COURSE  Pertinent labs & imaging results that were available during my care of the patient were reviewed by me and considered in my medical decision making (see chart for details).  ----------------------------------------- 2:48 PM on 11/04/2015 -----------------------------------------  Patient is resting comfortably at this time. His pain is completely relieved after morphine. I explained him the findings of the small kidney stone on his CAT scan. He is aware that he'll be discharged home with Percocet as well as Flomax. He is also aware that he'll be given a urine strainer so that he  may be into it to try and catch the stone and bring to the urologist. We discussed return precautions including any concerning or worsening symptoms but especially pain and fever. He knows to return immediately for any worsening pain or fever. Patient understands this plan and is willing to comply. ____________________________________________   FINAL CLINICAL IMPRESSION(S) / ED DIAGNOSES  Final diagnoses:  Left flank pain   Left UVJ calculus.   Myrna Blazer, MD 11/04/15 [phone]

## 2015-12-25 ENCOUNTER — Emergency Department: Payer: Self-pay

## 2015-12-25 ENCOUNTER — Encounter: Payer: Self-pay | Admitting: Emergency Medicine

## 2015-12-25 ENCOUNTER — Emergency Department
Admission: EM | Admit: 2015-12-25 | Discharge: 2015-12-25 | Disposition: A | Discharge status: Home / Self Care | Payer: Self-pay | Attending: Emergency Medicine | Admitting: Emergency Medicine

## 2015-12-25 DIAGNOSIS — N23 Unspecified renal colic: Secondary | ICD-10-CM | POA: Insufficient documentation

## 2015-12-25 DIAGNOSIS — F17210 Nicotine dependence, cigarettes, uncomplicated: Secondary | ICD-10-CM | POA: Insufficient documentation

## 2015-12-25 DIAGNOSIS — R0602 Shortness of breath: Secondary | ICD-10-CM | POA: Insufficient documentation

## 2015-12-25 DIAGNOSIS — Z79899 Other long term (current) drug therapy: Secondary | ICD-10-CM | POA: Insufficient documentation

## 2015-12-25 LAB — COMPREHENSIVE METABOLIC PANEL
ALBUMIN: 4.9 g/dL (ref 3.5–5.0)
ALT: 20 U/L (ref 17–63)
AST: 21 U/L (ref 15–41)
Alkaline Phosphatase: 66 U/L (ref 38–126)
Anion gap: 8 (ref 5–15)
BILIRUBIN TOTAL: 0.7 mg/dL (ref 0.3–1.2)
BUN: 15 mg/dL (ref 6–20)
CHLORIDE: 106 mmol/L (ref 101–111)
CO2: 27 mmol/L (ref 22–32)
CREATININE: 1.03 mg/dL (ref 0.61–1.24)
Calcium: 9.6 mg/dL (ref 8.9–10.3)
GFR calc Af Amer: >60 mL/min (ref >60)
GLUCOSE: 104 mg/dL — AB (ref 65–99)
POTASSIUM: 3.8 mmol/L (ref 3.5–5.1)
Sodium: 141 mmol/L (ref 135–145)
TOTAL PROTEIN: 7.6 g/dL (ref 6.5–8.1)

## 2015-12-25 LAB — CBC WITH DIFFERENTIAL/PLATELET
Basophils Absolute: 0 10*3/uL (ref 0–0.1)
Basophils Relative: 0 %
EOS ABS: 0.1 10*3/uL (ref 0–0.7)
EOS PCT: 1 %
HCT: 45.2 % (ref 40.0–52.0)
Hemoglobin: 15.1 g/dL (ref 13.0–18.0)
LYMPHS ABS: 1.5 10*3/uL (ref 1.0–3.6)
LYMPHS PCT: 18 %
MCH: 29.1 pg (ref 26.0–34.0)
MCHC: 33.4 g/dL (ref 32.0–36.0)
MCV: 87.3 fL (ref 80.0–100.0)
MONO ABS: 0.5 10*3/uL (ref 0.2–1.0)
Monocytes Relative: 6 %
Neutro Abs: 6.2 10*3/uL (ref 1.4–6.5)
Neutrophils Relative %: 75 %
PLATELETS: 223 10*3/uL (ref 150–440)
RBC: 5.18 MIL/uL (ref 4.40–5.90)
RDW: 13.8 % (ref 11.5–14.5)
WBC: 8.3 10*3/uL (ref 3.8–10.6)

## 2015-12-25 LAB — URINALYSIS COMPLETE WITH MICROSCOPIC (ARMC ONLY)
BACTERIA UA: NONE SEEN
BILIRUBIN URINE: NEGATIVE
GLUCOSE, UA: NEGATIVE mg/dL
Hgb urine dipstick: NEGATIVE
Ketones, ur: NEGATIVE mg/dL
Leukocytes, UA: NEGATIVE
Nitrite: NEGATIVE
Protein, ur: NEGATIVE mg/dL
SQUAMOUS EPITHELIAL / LPF: NONE SEEN
Specific Gravity, Urine: 1.025 (ref 1.005–1.030)
pH: 5 (ref 5.0–8.0)

## 2015-12-25 LAB — LIPASE, BLOOD: LIPASE: 24 U/L (ref 11–51)

## 2015-12-25 MED ORDER — HYDROCODONE-ACETAMINOPHEN 5-325 MG PO TABS: 1.0000 | ORAL_TABLET | Freq: Four times a day (QID) | ORAL | Status: DC | PRN

## 2015-12-25 MED ORDER — KETOROLAC TROMETHAMINE 30 MG/ML IJ SOLN
INTRAMUSCULAR | Status: AC
  Administered 2015-12-25: 30 mg via INTRAVENOUS
  Filled 2015-12-25: qty 1

## 2015-12-25 MED ORDER — HYDROCODONE-ACETAMINOPHEN 5-325 MG PO TABS
1.0000 | ORAL_TABLET | Freq: Once | ORAL | Status: AC
  Administered 2015-12-25: 1 via ORAL

## 2015-12-25 MED ORDER — KETOROLAC TROMETHAMINE 30 MG/ML IJ SOLN
30.0000 mg | Freq: Once | INTRAMUSCULAR | Status: AC
  Administered 2015-12-25: 30 mg via INTRAVENOUS

## 2015-12-25 MED ORDER — ONDANSETRON HCL 4 MG PO TABS: 4.0000 mg | ORAL_TABLET | Freq: Three times a day (TID) | ORAL | Status: DC | PRN

## 2015-12-25 MED ORDER — MORPHINE SULFATE (PF) 4 MG/ML IV SOLN
4.0000 mg | Freq: Once | INTRAVENOUS | Status: AC
  Administered 2015-12-25: 4 mg via INTRAVENOUS
  Filled 2015-12-25: qty 1

## 2015-12-25 MED ORDER — KETOROLAC TROMETHAMINE 10 MG PO TABS: 10.0000 mg | ORAL_TABLET | Freq: Three times a day (TID) | ORAL | Status: DC | PRN

## 2015-12-25 MED ORDER — TAMSULOSIN HCL 0.4 MG PO CAPS: 0.4000 mg | ORAL_CAPSULE | Freq: Every day | ORAL | Status: DC

## 2015-12-25 MED ORDER — ONDANSETRON HCL 4 MG/2ML IJ SOLN
4.0000 mg | Freq: Once | INTRAMUSCULAR | Status: AC
  Administered 2015-12-25: 4 mg via INTRAVENOUS
  Filled 2015-12-25: qty 2

## 2015-12-25 MED ORDER — HYDROCODONE-ACETAMINOPHEN 5-325 MG PO TABS
ORAL_TABLET | ORAL | Status: AC
  Administered 2015-12-25: 1 via ORAL
  Filled 2015-12-25: qty 1

## 2015-12-25 NOTE — ED Notes (Signed)
Pt resting in bed in no acute distress 

## 2015-12-25 NOTE — Discharge Instructions (Signed)
You were evaluated for flank pain and found to have blood in the urine and her symptoms are consistent with a kidney stone. We discussed her exam and evaluation are reassuring. Return to the emergency room for any worsening condition including worsening pain, fever, inability to urinate, or any other symptoms concerning to you.   Kidney Stones Kidney stones (urolithiasis) are deposits that form inside your kidneys. The intense pain is caused by the stone moving through the urinary tract. When the stone moves, the ureter goes into spasm around the stone. The stone is usually passed in the urine.  CAUSES   A disorder that makes certain neck glands produce too much parathyroid hormone (primary hyperparathyroidism).  A buildup of uric acid crystals, similar to gout in your joints.  Narrowing (stricture) of the ureter.  A kidney obstruction present at birth (congenital obstruction).  Previous surgery on the kidney or ureters.  Numerous kidney infections. SYMPTOMS   Feeling sick to your stomach (nauseous).  Throwing up (vomiting).  Blood in the urine (hematuria).  Pain that usually spreads (radiates) to the groin.  Frequency or urgency of urination. DIAGNOSIS   Taking a history and physical exam.  Blood or urine tests.  CT scan.  Occasionally, an examination of the inside of the urinary bladder (cystoscopy) is performed. TREATMENT   Observation.  Increasing your fluid intake.  Extracorporeal shock wave lithotripsy--This is a noninvasive procedure that uses shock waves to break up kidney stones.  Surgery may be needed if you have severe pain or persistent obstruction. There are various surgical procedures. Most of the procedures are performed with the use of small instruments. Only small incisions are needed to accommodate these instruments, so recovery time is minimized. The size, location, and chemical composition are all important variables that will determine the proper  choice of action for you. Talk to your health care provider to better understand your situation so that you will minimize the risk of injury to yourself and your kidney.  HOME CARE INSTRUCTIONS   Drink enough water and fluids to keep your urine clear or pale yellow. This will help you to pass the stone or stone fragments.  Strain all urine through the provided strainer. Keep all particulate matter and stones for your health care provider to see. The stone causing the pain may be as small as a grain of salt. It is very important to use the strainer each and every time you pass your urine. The collection of your stone will allow your health care provider to analyze it and verify that a stone has actually passed. The stone analysis will often identify what you can do to reduce the incidence of recurrences.  Only take over-the-counter or prescription medicines for pain, discomfort, or fever as directed by your health care provider.  Keep all follow-up visits as told by your health care provider. This is important.  Get follow-up X-rays if required. The absence of pain does not always mean that the stone has passed. It may have only stopped moving. If the urine remains completely obstructed, it can cause loss of kidney function or even complete destruction of the kidney. It is your responsibility to make sure X-rays and follow-ups are completed. Ultrasounds of the kidney can show blockages and the status of the kidney. Ultrasounds are not associated with any radiation and can be performed easily in a matter of minutes.  Make changes to your daily diet as told by your health care provider. You may be told to:  Limit the amount of salt that you eat.  Eat 5 or more servings of fruits and vegetables each day.  Limit the amount of meat, poultry, fish, and eggs that you eat.  Collect a 24-hour urine sample as told by your health care provider.You may need to collect another urine sample every 6-12  months. SEEK MEDICAL CARE IF:  You experience pain that is progressive and unresponsive to any pain medicine you have been prescribed. SEEK IMMEDIATE MEDICAL CARE IF:   Pain cannot be controlled with the prescribed medicine.  You have a fever or shaking chills.  The severity or intensity of pain increases over 18 hours and is not relieved by pain medicine.  You develop a new onset of abdominal pain.  You feel faint or pass out.  You are unable to urinate.   This information is not intended to replace advice given to you by your health care provider. Make sure you discuss any questions you have with your health care provider.   Document Released: 11/13/2005 Document Revised: 08/04/2015 Document Reviewed: 04/16/2013 Elsevier Interactive Patient Education [employer].

## 2015-12-25 NOTE — ED Notes (Signed)
Pt given discharge instructions and follow up information.  Steffanie Rainwater driving patient home. DC medications explained.  No other questions. Pt is stable for DC.

## 2015-12-25 NOTE — ED Provider Notes (Signed)
[facility] Emergency Department Provider Note   ____________________________________________  Time seen: Approximately 9 AM I have reviewed the triage vital signs and the triage nursing note.  HISTORY  Chief Complaint Flank Pain   Historian Patient  HPI Scott George is a 30 y.o. male with a history of prior kidney stones, who has had new left flank pain for less than 24 hours.. States he was seen for similar symptoms and had a CT scan a few months ago. Since then he's been using a urine strainer and he has not seen a stone pass and he still having left-sided intermittent flank pain. The last stone he passed besides this episode was about one month ago, he hasn't frequently. He is having dysuria and difficulty starting his urine stream.  He is also reporting having some shortness of breath, without any pleuritic chest pain. This is for about the last 24 hours. He thinks it may be due to pain response. He states he had a fever 102 this morning and it went away after Tylenol dose.  Pain symptoms are moderate. No exacerbating or alleviating symptoms.    History reviewed. No pertinent past medical history.  There are no active problems to display for this patient.   Past Surgical History  Procedure Laterality Date  . Cyst removal neck    . Tonsillectomy      Current Outpatient Rx  Name  Route  Sig  Dispense  Refill  . ibuprofen (ADVIL,MOTRIN) 200 MG tablet   Oral   Take 800 mg by mouth every 6 (six) hours as needed for mild pain or moderate pain.          . diphenhydrAMINE (BENADRYL) 25 mg capsule   Oral   Take 1 capsule (25 mg total) by mouth every 4 (four) hours as needed. Patient taking differently: Take 25 mg by mouth every 4 (four) hours as needed for itching or allergies.    30 capsule   2   . docusate sodium (COLACE) 100 MG capsule   Oral   Take 1 capsule (100 mg total) by mouth daily as needed. Patient not taking: Reported on  11/04/2015   30 capsule   2   . HYDROcodone-acetaminophen (NORCO/VICODIN) 5-325 MG tablet   Oral   Take 1-2 tablets by mouth every 6 (six) hours as needed for moderate pain.   10 tablet   0   . hydrocortisone (ANUSOL-HC) 2.5 % rectal cream   Rectal   Place 1 application rectally 2 (two) times daily. Patient not taking: Reported on 11/04/2015   30 g   0   . ketorolac (TORADOL) 10 MG tablet   Oral   Take 1 tablet (10 mg total) by mouth every 8 (eight) hours as needed for moderate pain.   15 tablet   0   . ondansetron (ZOFRAN) 4 MG tablet   Oral   Take 1 tablet (4 mg total) by mouth every 8 (eight) hours as needed for nausea or vomiting.   10 tablet   0   . oxyCODONE-acetaminophen (ROXICET) 5-325 MG tablet   Oral   Take 1-2 tablets by mouth every 6 (six) hours as needed.   15 tablet   0   . predniSONE (DELTASONE) 50 MG tablet   Oral   Take 1 tablet (50 mg total) by mouth daily with breakfast. Patient not taking: Reported on 11/04/2015   5 tablet   0   . tamsulosin (FLOMAX) 0.4 MG CAPS capsule  Oral   Take 1 capsule (0.4 mg total) by mouth daily.   7 capsule   0     Allergies Review of patient's allergies indicates no known allergies.  No family history on file.  Social History Social History  Substance Use Topics  . Smoking status: Current Every Day Smoker -- 0.50 packs/day    Types: Cigarettes  . Smokeless tobacco: None  . Alcohol Use: No    Review of Systems  Constitutional: Positive for fever. Eyes: Negative for visual changes. ENT: Negative for sore throat. Cardiovascular: Negative for chest pain. Respiratory: Positive for shortness of breath. Gastrointestinal: Negative for diarrhea. Genitourinary: Positive for dysuria. Musculoskeletal: Positive for left flank pain. Skin: Negative for rash. Neurological: Negative for headache. 10 point Review of Systems otherwise negative ____________________________________________   PHYSICAL  EXAM:  VITAL SIGNS: ED Triage Vitals  Enc Vitals Group     BP 12/25/15 0847 127/89 mmHg     Pulse Rate 12/25/15 0847 89     Resp 12/25/15 0847 20     Temp 12/25/15 0847 97.7 F (36.5 C)     Temp Source 12/25/15 0847 Oral     SpO2 12/25/15 0847 99 %     Weight 12/25/15 0847 195 lb (88.451 kg)     Height 12/25/15 0847  (1.905 m)     Head Cir --      Peak Flow --      Pain Score 12/25/15 0849 10     Pain Loc --      Pain Edu? --      Excl. in GC? --      Constitutional: Alert and oriented. Well appearing and in no distress. Eyes: Conjunctivae are normal. PERRL. Normal extraocular movements. ENT   Head: Normocephalic and atraumatic.   Nose: No congestion/rhinnorhea.   Mouth/Throat: Mucous membranes are moist.   Neck: No stridor. Cardiovascular/Chest: Normal rate, regular rhythm.  No murmurs, rubs, or gallops. Respiratory: Normal respiratory effort without tachypnea nor retractions. Breath sounds are clear and equal bilaterally. No wheezes/rales/rhonchi. Gastrointestinal: Soft. No distention, no guarding, no rebound. Nontender.  Left and right CVA tenderness. Genitourinary/rectal:Deferred Musculoskeletal: Nontender with normal range of motion in all extremities. No joint effusions.  No lower extremity tenderness.  No edema. Neurologic:  Normal speech and language. No gross or focal neurologic deficits are appreciated. Skin:  Skin is warm, dry and intact. No rash noted. Psychiatric: Mood and affect are normal. Speech and behavior are normal. Patient exhibits appropriate insight and judgment.  ____________________________________________   EKG I, Governor Rooks, MD, the attending physician have personally viewed and interpreted all ECGs.  None ____________________________________________  LABS (pertinent positives/negatives)  Comprehensive metabolic panel within normal limits CBC within normal limits Lipase 24 Urinalysis 6-30 red blood cells and otherwise  negative  ____________________________________________  RADIOLOGY All Xrays were viewed by me. Imaging interpreted by Radiologist.  Chest 2 view: No active cardiopulmonary disease  Renal ultrasound: No evidence of hydronephrosis or other renal abnormality. Nonobstructing bilateral renal calculi. __________________________________________  PROCEDURES  Procedure(s) performed: None  Critical Care performed: None  ____________________________________________   ED COURSE / ASSESSMENT AND PLAN  Pertinent labs & imaging results that were available during my care of the patient were reviewed by me and considered in my medical decision making (see chart for details).   Patient is here for persistent left flank pain concerning for persistent kidney stone. In addition is complaining of a fever this morning to 102 which is now down after Tylenol and  some shortness of breath episodes. It does not sound classic for PE, and there is no chest pain or pleuritic chest pain. He is not hypoxic or tachycardic. I will add on a chest x-ray.   Hematuria on UA, with reassuring lab studies including normal kidney function. I reviewed the patient's CT from a few months ago showing small stone. I suspect he passed about 1 and is now passing another stone.  I am going to treat him symptomatically for renal colic/kidney stone with Zofran, Norco, ketorolac, and Flomax.  I'm unsure what to make of the report of fever, he is afebrile now. In terms of the shortness of breath, he experiences the subjective feeling of shortness of breath when he has pain and I suspect it made just be due to pain response. Again suspicion for acute cardiopulmonary emergency is extremely low.    CONSULTATIONS:   None   Patient / Family / Caregiver informed of clinical course, medical decision-making process, and agree with plan.   I discussed return precautions, follow-up instructions, and discharged instructions with patient  and/or family.  Discharge instructions:  You were evaluated for flank pain and found to have blood in the urine and your symptoms are consistent with a kidney stone. We discussed her exam and evaluation are reassuring. Return to the emergency room for any worsening condition including worsening pain, fever, inability to urinate, or any other symptoms concerning to you.  ___________________________________________   FINAL CLINICAL IMPRESSION(S) / ED DIAGNOSES   Final diagnoses:  Ureteral colic              Note: This dictation was prepared with Dragon dictation. Any transcriptional errors that result from this process are unintentional   Governor Rooks, MD 12/25/15 1231

## 2015-12-25 NOTE — ED Notes (Signed)
Pt returned from [place], pt in no acute distress

## 2015-12-25 NOTE — ED Notes (Signed)
L flank pain x several months, states has kidney stone

## 2015-12-25 NOTE — ED Notes (Signed)
Pt states left sided flank pain for several weeks, states its hard to get a urine stream going, hx of kidney stones

## 2015-12-25 NOTE — ED Notes (Signed)
Pt in [place]

## 2016-01-04 NOTE — ED Provider Notes (Signed)
EKG viewed and interpreted by attending physician Dr. Governor Rooks, myself.  64 bpm. Normal sinus rhythm. Narrow QRS. Normal axis. Normal ST and T-wave  Governor Rooks, MD 01/04/16 1219

## 2016-06-08 ENCOUNTER — Emergency Department: Payer: Self-pay

## 2016-06-08 ENCOUNTER — Encounter: Payer: Self-pay | Admitting: Emergency Medicine

## 2016-06-08 ENCOUNTER — Emergency Department
Admission: EM | Admit: 2016-06-08 | Discharge: 2016-06-08 | Disposition: A | Discharge status: Home / Self Care | Payer: Self-pay | Attending: Emergency Medicine | Admitting: Emergency Medicine

## 2016-06-08 DIAGNOSIS — R51 Headache: Secondary | ICD-10-CM | POA: Insufficient documentation

## 2016-06-08 DIAGNOSIS — R519 Headache, unspecified: Secondary | ICD-10-CM

## 2016-06-08 DIAGNOSIS — F17210 Nicotine dependence, cigarettes, uncomplicated: Secondary | ICD-10-CM | POA: Insufficient documentation

## 2016-06-08 LAB — COMPREHENSIVE METABOLIC PANEL
ALK PHOS: 59 U/L (ref 38–126)
ALT: 17 U/L (ref 17–63)
ANION GAP: 7 (ref 5–15)
AST: 18 U/L (ref 15–41)
Albumin: 4.5 g/dL (ref 3.5–5.0)
BUN: 18 mg/dL (ref 6–20)
CALCIUM: 9.3 mg/dL (ref 8.9–10.3)
CHLORIDE: 105 mmol/L (ref 101–111)
CO2: 28 mmol/L (ref 22–32)
Creatinine, Ser: 0.95 mg/dL (ref 0.61–1.24)
GFR calc non Af Amer: >60 mL/min (ref >60)
Glucose, Bld: 100 mg/dL — ABNORMAL HIGH (ref 65–99)
POTASSIUM: 4.1 mmol/L (ref 3.5–5.1)
SODIUM: 140 mmol/L (ref 135–145)
Total Bilirubin: 0.5 mg/dL (ref 0.3–1.2)
Total Protein: 6.8 g/dL (ref 6.5–8.1)

## 2016-06-08 LAB — CBC
HCT: 43.6 % (ref 40.0–52.0)
HEMOGLOBIN: 14.7 g/dL (ref 13.0–18.0)
MCH: 30.2 pg (ref 26.0–34.0)
MCHC: 33.7 g/dL (ref 32.0–36.0)
MCV: 89.7 fL (ref 80.0–100.0)
Platelets: 170 10*3/uL (ref 150–440)
RBC: 4.86 MIL/uL (ref 4.40–5.90)
RDW: 13 % (ref 11.5–14.5)
WBC: 6 10*3/uL (ref 3.8–10.6)

## 2016-06-08 LAB — LIPASE, BLOOD: LIPASE: 21 U/L (ref 11–51)

## 2016-06-08 MED ORDER — METOCLOPRAMIDE HCL 5 MG/ML IJ SOLN
10.0000 mg | Freq: Once | INTRAMUSCULAR | Status: AC
  Administered 2016-06-08: 10 mg via INTRAVENOUS
  Filled 2016-06-08: qty 2

## 2016-06-08 MED ORDER — KETOROLAC TROMETHAMINE 30 MG/ML IJ SOLN
15.0000 mg | Freq: Once | INTRAMUSCULAR | Status: AC
  Administered 2016-06-08: 15 mg via INTRAVENOUS
  Filled 2016-06-08: qty 1

## 2016-06-08 MED ORDER — METOCLOPRAMIDE HCL 10 MG PO TABS: 10.0000 mg | ORAL_TABLET | Freq: Three times a day (TID) | ORAL | Status: DC | PRN

## 2016-06-08 MED ORDER — KETOROLAC TROMETHAMINE 10 MG PO TABS: 10.0000 mg | ORAL_TABLET | Freq: Four times a day (QID) | ORAL | Status: DC | PRN

## 2016-06-08 MED ORDER — SODIUM CHLORIDE 0.9 % IV BOLUS (SEPSIS)
1000.0000 mL | Freq: Once | INTRAVENOUS | Status: AC
Start: 2016-06-08 — End: 2016-06-08
  Administered 2016-06-08: 1000 mL via INTRAVENOUS

## 2016-06-08 NOTE — ED Provider Notes (Signed)
Time Seen: Approximately 1508  I have reviewed the triage notes  Chief Complaint: Headache and Abdominal Pain   History of Present Illness: Scott George is a 30 y.o. male who states of new onset headache for him mainly bilateral frontal with some mild photophobia. His significant other states that he seems to be somewhat drowsy. He's had nausea with no persistent vomiting. He denies any diarrhea or abdominal pain to this historian. He denies any focal weakness, neck pain, fever, head trauma. He is not aware of having similar headaches before in the past.   History reviewed. No pertinent past medical history.  There are no active problems to display for this patient.   Past Surgical History  Procedure Laterality Date  . Cyst removal neck    . Tonsillectomy      Past Surgical History  Procedure Laterality Date  . Cyst removal neck    . Tonsillectomy      Current Outpatient Rx  Name  Route  Sig  Dispense  Refill  . diphenhydrAMINE (BENADRYL) 25 mg capsule   Oral   Take 1 capsule (25 mg total) by mouth every 4 (four) hours as needed. Patient taking differently: Take 25 mg by mouth every 4 (four) hours as needed for itching or allergies.    30 capsule   2   . docusate sodium (COLACE) 100 MG capsule   Oral   Take 1 capsule (100 mg total) by mouth daily as needed. Patient not taking: Reported on 11/04/2015   30 capsule   2   . HYDROcodone-acetaminophen (NORCO/VICODIN) 5-325 MG tablet   Oral   Take 1-2 tablets by mouth every 6 (six) hours as needed for moderate pain.   10 tablet   0   . hydrocortisone (ANUSOL-HC) 2.5 % rectal cream   Rectal   Place 1 application rectally 2 (two) times daily. Patient not taking: Reported on 11/04/2015   30 g   0   . ibuprofen (ADVIL,MOTRIN) 200 MG tablet   Oral   Take 800 mg by mouth every 6 (six) hours as needed for mild pain or moderate pain.          Marland Kitchen ketorolac (TORADOL) 10 MG tablet   Oral   Take 1 tablet (10 mg  total) by mouth every 8 (eight) hours as needed for moderate pain.   15 tablet   0   . ondansetron (ZOFRAN) 4 MG tablet   Oral   Take 1 tablet (4 mg total) by mouth every 8 (eight) hours as needed for nausea or vomiting.   10 tablet   0   . oxyCODONE-acetaminophen (ROXICET) 5-325 MG tablet   Oral   Take 1-2 tablets by mouth every 6 (six) hours as needed.   15 tablet   0   . predniSONE (DELTASONE) 50 MG tablet   Oral   Take 1 tablet (50 mg total) by mouth daily with breakfast. Patient not taking: Reported on 11/04/2015   5 tablet   0   . tamsulosin (FLOMAX) 0.4 MG CAPS capsule   Oral   Take 1 capsule (0.4 mg total) by mouth daily.   7 capsule   0     Allergies:  Review of patient's allergies indicates no known allergies.  Family History: No family history on file.  Social History: Social History  Substance Use Topics  . Smoking status: Current Every Day Smoker -- 0.50 packs/day    Types: Cigarettes  . Smokeless tobacco: None  .  Alcohol Use: No     Review of Systems:   10 point review of systems was performed and was otherwise negative:  Constitutional: No fever Eyes: No visual disturbances ENT: No sore throat, ear pain Cardiac: No chest pain Respiratory: No shortness of breath, wheezing, or stridor Abdomen: No abdominal pain, no vomiting, No diarrhea Endocrine: No weight loss, No night sweats Extremities: No peripheral edema, cyanosis Skin: No rashes, easy bruising Neurologic: No focal weakness, trouble with speech or swollowing Urologic: No dysuria, Hematuria, or urinary frequency   Physical Exam:  ED Triage Vitals  Enc Vitals Group     BP 06/08/16 1342 131/82 mmHg     Pulse Rate 06/08/16 1342 64     Resp 06/08/16 1342 18     Temp 06/08/16 1342 98.5 F (36.9 C)     Temp Source 06/08/16 1342 Oral     SpO2 06/08/16 1342 100 %     Weight 06/08/16 1342 190 lb (86.183 kg)     Height 06/08/16 1342 6\' 3"  (1.905 m)     Head Cir --      Peak Flow --       Pain Score 06/08/16 1343 8     Pain Loc --      Pain Edu? --      Excl. in GC? --     General: Awake , Alert , and Oriented times 3; GCS 15 Head: Normal cephalic , atraumatic. Mild reproducible pattern to his headache with palpation across the temple regions Eyes: Pupils equal , round, reactive to light. Extraocular eye movements are intact without any papilledema and some mild photophobia. Nose/Throat: No nasal drainage, patent upper airway without erythema or exudate.  Neck: Supple, Full range of motion, No anterior adenopathy or palpable thyroid masses Lungs: Clear to ascultation without wheezes , rhonchi, or rales Heart: Regular rate, regular rhythm without murmurs , gallops , or rubs Abdomen: Soft, non tender without rebound, guarding , or rigidity; bowel sounds positive and symmetric in all 4 quadrants. No organomegaly .        Extremities: 2 plus symmetric pulses. No edema, clubbing or cyanosis Neurologic: normal ambulation, Motor symmetric without deficits, sensory intact Skin: warm, dry, no rashes   Labs:   All laboratory work was reviewed including any pertinent negatives or positives listed below:  Labs Reviewed  COMPREHENSIVE METABOLIC PANEL - Abnormal; Notable for the following:    Glucose, Bld 100 (*)    All other components within normal limits  LIPASE, BLOOD  CBC    Radiology:      CT HEAD WO CONTRAST (Final result) Result time: 06/08/16 15:50:10   Final result by Rad Results In Interface (06/08/16 15:50:10)   Narrative:   CLINICAL DATA: Headache beginning today.  EXAM: CT HEAD WITHOUT CONTRAST  TECHNIQUE: Contiguous axial images were obtained from the base of the skull through the vertex without intravenous contrast.  COMPARISON: CT head without contrast 12/30/2014  FINDINGS: Brain: No acute infarct, hemorrhage, or mass lesion is present. The ventricles are of normal size. No significant extraaxial fluid collection is  present.  Vascular: Unremarkable  Skull: The calvarium is intact.  Sinuses/Orbits: Extensive ethmoid opacification is present bilaterally. The frontal sinuses are clear. Polyps or mucous retention cysts are present in the sphenoid sinuses bilaterally. A larger polyp is present in the right maxillary sinus. Mild mucosal thickening is present posteriorly in the maxillary sinuses bilaterally.  Other:  IMPRESSION: 1. Normal CT appearance the brain. 2. Moderate sinus disease  as described.         I personally reviewed the radiologic studies    ED Course:  Patient's stay was uneventful and he was given IV fluids along with Reglan and Toradol with symptomatic relief. I felt this was unlikely to be a life-threatening headache. I don't feel the patient requires lumbar puncture at this time. Patient was given general headache instructions and this may be muscle tension with migraine-type symptoms.    Assessment: * Acute unspecified cephalgia      Plan: * Outpatient management Prescription for Toradol and Reglan Patient was advised to return immediately if condition worsens. Patient was advised to follow up with their primary care physician or other specialized physicians involved in their outpatient care. The patient and/or family member/power of attorney had laboratory results reviewed at the bedside. All questions and concerns were addressed and appropriate discharge instructions were distributed by the nursing staff.             Jennye MoccasinBrian S Maelle Sheaffer, MD 06/08/16 [phone]

## 2016-06-08 NOTE — Discharge Instructions (Signed)
General Headache Without Cause °A headache is pain or discomfort felt around the head or neck area. There are many causes and types of headaches. In some cases, the cause may not be found.  °HOME CARE  °Managing Pain °· Take over-the-counter and prescription medicines only as told by your doctor. °· Lie down in a dark, quiet room when you have a headache. °· If directed, apply ice to the head and neck area: °¨ Put ice in a plastic bag. °¨ Place a towel between your skin and the bag. °¨ Leave the ice on for 20 minutes, 2-3 times per day. °· Use a heating pad or hot shower to apply heat to the head and neck area as told by your doctor. °· Keep lights dim if bright lights bother you or make your headaches worse. °Eating and Drinking °· Eat meals on a regular schedule. °· Lessen how much alcohol you drink. °· Lessen how much caffeine you drink, or stop drinking caffeine. °General Instructions °· Keep all follow-up visits as told by your doctor. This is important. °· Keep a journal to find out if certain things bring on headaches. For example, write down: °¨ What you eat and drink. °¨ How much sleep you get. °¨ Any change to your diet or medicines. °· Relax by getting a massage or doing other relaxing activities. °· Lessen stress. °· Sit up straight. Do not tighten (tense) your muscles. °· Do not use tobacco products. This includes cigarettes, chewing tobacco, or e-cigarettes. If you need help quitting, ask your doctor. °· Exercise regularly as told by your doctor. °· Get enough sleep. This often means 7-9 hours of sleep. °GET HELP IF: °· Your symptoms are not helped by medicine. °· You have a headache that feels different than the other headaches. °· You feel sick to your stomach (nauseous) or you throw up (vomit). °· You have a fever. °GET HELP RIGHT AWAY IF:  °· Your headache becomes really bad. °· You keep throwing up. °· You have a stiff neck. °· You have trouble seeing. °· You have trouble speaking. °· You have  pain in the eye or ear. °· Your muscles are weak or you lose muscle control. °· You lose your balance or have trouble walking. °· You feel like you will pass out (faint) or you pass out. °· You have confusion. °  °This information is not intended to replace advice given to you by your health care provider. Make sure you discuss any questions you have with your health care provider. °  °Document Released: 08/22/2008 Document Revised: 08/04/2015 Document Reviewed: 03/08/2015 °Elsevier Interactive Patient Education ©2016 [employer] ° °Please return immediately if condition worsens. Please contact her primary physician or the physician you were given for referral. If you have any specialist physicians involved in her treatment and plan please also contact them. Thank you for using [facility]. ° °

## 2016-06-08 NOTE — ED Notes (Signed)
Pt complains of headache and abdominal pain that started this morning. Pt complains of nausea but denies vomiting and diarrhea.

## 2016-10-05 ENCOUNTER — Encounter: Payer: Self-pay | Admitting: Emergency Medicine

## 2016-10-05 ENCOUNTER — Emergency Department
Admission: EM | Admit: 2016-10-05 | Discharge: 2016-10-05 | Disposition: A | Discharge status: Home / Self Care | Payer: Self-pay | Attending: Student in an Organized Health Care Education/Training Program | Admitting: Student in an Organized Health Care Education/Training Program

## 2016-10-05 DIAGNOSIS — L853 Xerosis cutis: Secondary | ICD-10-CM | POA: Insufficient documentation

## 2016-10-05 DIAGNOSIS — F17210 Nicotine dependence, cigarettes, uncomplicated: Secondary | ICD-10-CM | POA: Insufficient documentation

## 2016-10-05 DIAGNOSIS — L739 Follicular disorder, unspecified: Secondary | ICD-10-CM | POA: Insufficient documentation

## 2016-10-05 DIAGNOSIS — K120 Recurrent oral aphthae: Secondary | ICD-10-CM | POA: Insufficient documentation

## 2016-10-05 DIAGNOSIS — Z79899 Other long term (current) drug therapy: Secondary | ICD-10-CM | POA: Insufficient documentation

## 2016-10-05 MED ORDER — CLOBETASOL PROPIONATE 0.05 % EX OINT: 1.0000 "application " | TOPICAL_OINTMENT | Freq: Two times a day (BID) | CUTANEOUS | 0 refills | Status: AC

## 2016-10-05 MED ORDER — TRIAMCINOLONE 0.1 % CREAM:EUCERIN CREAM 1:1: 1.0000 "application " | TOPICAL_CREAM | Freq: Two times a day (BID) | CUTANEOUS | 1 refills | Status: AC | PRN

## 2016-10-05 NOTE — ED Triage Notes (Signed)
Pt c/o rash that started one week ago helping friend move. No visible rash noted this RN in triage, dry skin noted to nose. Also c/o sore to right side of mouth.

## 2016-10-05 NOTE — ED Provider Notes (Signed)
[facility] Emergency Department Provider Note  ____________________________________________  Time seen: Approximately 6:33 PM  I have reviewed the triage vital signs and the nursing notes.   HISTORY  Chief Complaint Rash  HPI Scott George Petitti is a 30 y.o. male that presents with an area of skin irritation on the nose that started 1 week ago, a sore in his mouth that started a couple days ago, and some skin irritation on his legs that started in childhood. Patient states that his wife has areas of dryness as well and is worried that he caught them from her or is having an allergic reaction to something. Area itches but does not hurt. Patient states that he's never had a sore in his mouth before and does not recall any trauma. Sore has a burning sensation to it. Rash on legs has been there since childhood. It does not itch or hurt. Patient states that he gets a similar rash on chin and neck after shaving. Patient has no known allergies.  History reviewed. No pertinent past medical history.  There are no active problems to display for this patient.   Past Surgical History:  Procedure Laterality Date  . CYST REMOVAL NECK    . TONSILLECTOMY      Prior to Admission medications   Medication Sig Start Date End Date Taking? Authorizing Provider  clobetasol ointment (TEMOVATE) 0.05 % Apply 1 application topically 2 (two) times daily. Apply to canker sore 2 times daily. Dry canker sore with gauze before. Avoid eating or drinking for 30 minutes after. 10/05/16 11/04/16  Enid DerryAshley Yosgart Pavey, PA-C  diphenhydrAMINE (BENADRYL) 25 mg capsule Take 1 capsule (25 mg total) by mouth every 4 (four) hours as needed. Patient taking differently: Take 25 mg by mouth every 4 (four) hours as needed for itching or allergies.  10/22/15 10/21/16  Jene Everyobert Kinner, MD  HYDROcodone-acetaminophen (NORCO/VICODIN) 5-325 MG tablet Take 1-2 tablets by mouth every 6 (six) hours as needed for moderate pain.  12/25/15   Governor Rooksebecca Lord, MD  hydrocortisone (ANUSOL-HC) 2.5 % rectal cream Place 1 application rectally 2 (two) times daily. Patient not taking: Reported on 11/04/2015 08/04/15   Emily FilbertJonathan E Williams, MD  ibuprofen (ADVIL,MOTRIN) 200 MG tablet Take 800 mg by mouth every 6 (six) hours as needed for mild pain or moderate pain.     Historical Provider, MD  ketorolac (TORADOL) 10 MG tablet Take 1 tablet (10 mg total) by mouth every 6 (six) hours as needed. 06/08/16   Jennye MoccasinBrian S Quigley, MD  metoCLOPramide (REGLAN) 10 MG tablet Take 1 tablet (10 mg total) by mouth every 8 (eight) hours as needed for nausea. 06/08/16 06/15/16  Jennye MoccasinBrian S Quigley, MD  ondansetron (ZOFRAN) 4 MG tablet Take 1 tablet (4 mg total) by mouth every 8 (eight) hours as needed for nausea or vomiting. 12/25/15   Governor Rooksebecca Lord, MD  oxyCODONE-acetaminophen (ROXICET) 5-325 MG tablet Take 1-2 tablets by mouth every 6 (six) hours as needed. 11/04/15   Myrna Blazeravid Matthew Schaevitz, MD  predniSONE (DELTASONE) 50 MG tablet Take 1 tablet (50 mg total) by mouth daily with breakfast. Patient not taking: Reported on 11/04/2015 10/22/15   Jene Everyobert Kinner, MD  tamsulosin (FLOMAX) 0.4 MG CAPS capsule Take 1 capsule (0.4 mg total) by mouth daily. 12/25/15   Governor Rooksebecca Lord, MD  Triamcinolone Acetonide (TRIAMCINOLONE 0.1 % CREAM : EUCERIN) CREA Apply 1 application topically 2 (two) times daily as needed for rash, itching or irritation. 10/05/16 10/19/16  Enid DerryAshley Vi Whitesel, PA-C    Allergies  Patient has no known allergies.  History reviewed. No pertinent family history.  Social History Social History  Substance Use Topics  . Smoking status: Current Every Day Smoker    Packs/day: 0.50    Types: Cigarettes  . Smokeless tobacco: Not on file  . Alcohol use No    Review of Systems  Constitutional: Negative for fever/chills Cardiovascular: Negative for chest pain or palpitations Respiratory: Negative for shortness of breath. Musculoskeletal: Negative for  pain.  ____________________________________________   PHYSICAL EXAM:  VITAL SIGNS: ED Triage Vitals  Enc Vitals Group     BP 10/05/16 1738 (!) 145/82     Pulse Rate 10/05/16 1738 81     Resp 10/05/16 1738 16     Temp 10/05/16 1738 97.8 F (36.6 C)     Temp Source 10/05/16 1738 Oral     SpO2 10/05/16 1738 100 %     Weight 10/05/16 1738 195 lb (88.5 kg)     Height 10/05/16 1738 6\' 3"  (1.905 m)     Head Circumference --      Peak Flow --      Pain Score 10/05/16 1739 5     Pain Loc --      Pain Edu? --      Excl. in GC? --      Constitutional: Alert and oriented. Well appearing and in no acute distress. Eyes: Conjunctivae are normal. EOMI. Nose: No congestion/rhinnorhea. Mouth/Throat: Mucous membranes are moist.   Neck: No stridor. Cardiovascular: Good peripheral circulation. Respiratory: Normal respiratory effort.  No retractions. Musculoskeletal: FROM throughout. Neurologic:  Normal speech and language. No gross focal neurologic deficits are appreciated. Skin:  1 inch area of scaling skin on nose. 1mm white papule on inside of left cheek. Several inflammed hair follicles on chin, neck, arms, and legs.  ____________________________________________   LABS (all labs ordered are listed, but only abnormal results are displayed)  Labs Reviewed - No data to display ____________________________________________    INITIAL IMPRESSION / ASSESSMENT AND PLAN / ED COURSE  Clinical Course    Assessment and plan My assessment is that patient has an area of dry skin on his nose, a canker sore in his mouth, and folliculitis on his face/chin/arms/legs. Lesion on nose could also be ring worm but there was no area of clearing in the center. There inflammation surrounding every hair follicle on face/arms/legs. This could be an allergy but patient does not recall being allergic to anything and given duration and location of rash this is unlikely.  Pertinent labs & imaging results that  were available during my care of the patient were reviewed by me and considered in my medical decision making (see chart for details).    Scott George Salce was [facility].  Scott George Stcyr was also advised to return to the emergency department for symptoms that change or worsen if unable to schedule an appointment.  ____________________________________________   FINAL CLINICAL IMPRESSION(S) / ED DIAGNOSES  Final diagnoses:  Canker sores oral  Dry skin  Folliculitis    New Prescriptions   CLOBETASOL OINTMENT (TEMOVATE) 0.05 %    Apply 1 application topically 2 (two) times daily. Apply to canker sore 2 times daily. Dry canker sore with gauze before. Avoid eating or drinking for 30 minutes after.   TRIAMCINOLONE ACETONIDE (TRIAMCINOLONE 0.1 % CREAM : EUCERIN) CREA    Apply 1 application topically 2 (two) times daily as needed for rash, itching or irritation.  Note:  This document was prepared using Dragon voice recognition software and may include unintentional dictation errors.   Enid Derry, PA-C 10/05/16 1916    Willy Eddy, MD 10/05/16 2209

## 2016-12-20 ENCOUNTER — Emergency Department: Payer: Self-pay

## 2016-12-20 ENCOUNTER — Emergency Department
Admission: EM | Admit: 2016-12-20 | Discharge: 2016-12-20 | Disposition: A | Discharge status: Home / Self Care | Payer: Self-pay | Attending: Emergency Medicine | Admitting: Emergency Medicine

## 2016-12-20 DIAGNOSIS — F419 Anxiety disorder, unspecified: Secondary | ICD-10-CM | POA: Insufficient documentation

## 2016-12-20 DIAGNOSIS — F17210 Nicotine dependence, cigarettes, uncomplicated: Secondary | ICD-10-CM | POA: Insufficient documentation

## 2016-12-20 DIAGNOSIS — Z79899 Other long term (current) drug therapy: Secondary | ICD-10-CM | POA: Insufficient documentation

## 2016-12-20 LAB — BASIC METABOLIC PANEL
Anion gap: 7 (ref 5–15)
BUN: 17 mg/dL (ref 6–20)
CALCIUM: 10 mg/dL (ref 8.9–10.3)
CO2: 31 mmol/L (ref 22–32)
Chloride: 103 mmol/L (ref 101–111)
Creatinine, Ser: 1.34 mg/dL — ABNORMAL HIGH (ref 0.61–1.24)
GFR calc Af Amer: >60 mL/min (ref >60)
GLUCOSE: 119 mg/dL — AB (ref 65–99)
Potassium: 3.5 mmol/L (ref 3.5–5.1)
SODIUM: 141 mmol/L (ref 135–145)

## 2016-12-20 LAB — CBC
HCT: 43.4 % (ref 40.0–52.0)
Hemoglobin: 15.1 g/dL (ref 13.0–18.0)
MCH: 30.9 pg (ref 26.0–34.0)
MCHC: 34.8 g/dL (ref 32.0–36.0)
MCV: 88.7 fL (ref 80.0–100.0)
PLATELETS: 224 10*3/uL (ref 150–440)
RBC: 4.89 MIL/uL (ref 4.40–5.90)
RDW: 13.1 % (ref 11.5–14.5)
WBC: 8.8 10*3/uL (ref 3.8–10.6)

## 2016-12-20 LAB — URINE DRUG SCREEN, QUALITATIVE (ARMC ONLY)
Amphetamines, Ur Screen: POSITIVE — AB
BARBITURATES, UR SCREEN: NOT DETECTED
BENZODIAZEPINE, UR SCRN: NOT DETECTED
CANNABINOID 50 NG, UR ~~LOC~~: NOT DETECTED
Cocaine Metabolite,Ur ~~LOC~~: POSITIVE — AB
MDMA (Ecstasy)Ur Screen: NOT DETECTED
Methadone Scn, Ur: NOT DETECTED
Opiate, Ur Screen: NOT DETECTED
PHENCYCLIDINE (PCP) UR S: NOT DETECTED
Tricyclic, Ur Screen: NOT DETECTED

## 2016-12-20 LAB — TROPONIN I

## 2016-12-20 LAB — ETHANOL

## 2016-12-20 MED ORDER — LORAZEPAM 1 MG PO TABS
1.0000 mg | ORAL_TABLET | Freq: Once | ORAL | Status: AC
  Administered 2016-12-20: 1 mg via ORAL
  Filled 2016-12-20: qty 1

## 2016-12-20 NOTE — ED Provider Notes (Signed)
[facility] Emergency Department Provider Note    ____________________________________________   I have reviewed the triage vital signs and the nursing notes.   HISTORY  Chief Complaint Anxiety and Chest Pain   History limited by: Not Limited   HPI Scott George is a 31 y.o. male who presents to the emergency department today because of concerns for anxiety and chest pain. Patient states he woke up and was having a panic attack. He states he was thinking about his grandmother who is been dead for a number of years. He was crying. He developed chest pain at that time. The chest pain and anxiety have been somewhat steady throughout the day. He states he has a history of anxiety and was seen and [facility] in the past for this. Denies any history of panic attacks.   History reviewed. No pertinent past medical history.  There are no active problems to display for this patient.   Past Surgical History:  Procedure Laterality Date  . CYST REMOVAL NECK    . TONSILLECTOMY      Prior to Admission medications   Medication Sig Start Date End Date Taking? Authorizing Provider  diphenhydrAMINE (BENADRYL) 25 mg capsule Take 1 capsule (25 mg total) by mouth every 4 (four) hours as needed. Patient taking differently: Take 25 mg by mouth every 4 (four) hours as needed for itching or allergies.  10/22/15 10/21/16  Jene Every, MD  HYDROcodone-acetaminophen (NORCO/VICODIN) 5-325 MG tablet Take 1-2 tablets by mouth every 6 (six) hours as needed for moderate pain. 12/25/15   Governor Rooks, MD  hydrocortisone (ANUSOL-HC) 2.5 % rectal cream Place 1 application rectally 2 (two) times daily. Patient not taking: Reported on 11/04/2015 08/04/15   Emily Filbert, MD  ibuprofen (ADVIL,MOTRIN) 200 MG tablet Take 800 mg by mouth every 6 (six) hours as needed for mild pain or moderate pain.     Historical Provider, MD  ketorolac (TORADOL) 10 MG tablet Take 1 tablet (10 mg total) by  mouth every 6 (six) hours as needed. 06/08/16   Jennye Moccasin, MD  metoCLOPramide (REGLAN) 10 MG tablet Take 1 tablet (10 mg total) by mouth every 8 (eight) hours as needed for nausea. 06/08/16 06/15/16  Jennye Moccasin, MD  ondansetron (ZOFRAN) 4 MG tablet Take 1 tablet (4 mg total) by mouth every 8 (eight) hours as needed for nausea or vomiting. 12/25/15   Governor Rooks, MD  oxyCODONE-acetaminophen (ROXICET) 5-325 MG tablet Take 1-2 tablets by mouth every 6 (six) hours as needed. 11/04/15   Myrna Blazer, MD  predniSONE (DELTASONE) 50 MG tablet Take 1 tablet (50 mg total) by mouth daily with breakfast. Patient not taking: Reported on 11/04/2015 10/22/15   Jene Every, MD  tamsulosin (FLOMAX) 0.4 MG CAPS capsule Take 1 capsule (0.4 mg total) by mouth daily. 12/25/15   Governor Rooks, MD    Allergies Patient has no known allergies.  No family history on file.  Social History Social History  Substance Use Topics  . Smoking status: Current Every Day Smoker    Packs/day: 0.50    Types: Cigarettes  . Smokeless tobacco: Not on file  . Alcohol use No    Review of Systems  Constitutional: Negative for fever. Cardiovascular: Positive for chest pain. Respiratory: Negative for shortness of breath. Gastrointestinal: Negative for abdominal pain, vomiting and diarrhea. Neurological: Negative for headaches, focal weakness or numbness. Psychiatric: Positive for anxiety 10-point ROS otherwise negative.  ____________________________________________   PHYSICAL EXAM:  VITAL  SIGNS: ED Triage Vitals  Enc Vitals Group     BP --      Pulse Rate 12/20/16 1616 (!) 108     Resp 12/20/16 1616 18     Temp 12/20/16 1616 98 F (36.7 C)     Temp Source 12/20/16 1616 Oral     SpO2 12/20/16 1616 98 %     Weight 12/20/16 1616 185 lb (83.9 kg)     Height 12/20/16 1616 6\' 4"  (1.93 m)     Head Circumference --      Peak Flow --      Pain Score 12/20/16 1617 4   Constitutional: Alert and  oriented. Pacing around the room. Eyes: Conjunctivae are normal. Normal extraocular movements. ENT   Head: Normocephalic and atraumatic.   Nose: No congestion/rhinnorhea.   Mouth/Throat: Mucous membranes are moist.   Neck: No stridor. Cardiovascular: Normal rate, regular rhythm.  No murmurs, rubs, or gallops.  Respiratory: Normal respiratory effort without tachypnea nor retractions. Breath sounds are clear and equal bilaterally. No wheezes/rales/rhonchi. Gastrointestinal: Soft and non tender. No rebound. No guarding.  Genitourinary: Deferred Musculoskeletal: Normal range of motion in all extremities. Neurologic:  Normal speech and language. No gross focal neurologic deficits are appreciated.  Skin:  Skin is warm, dry and intact. No rash noted. Psychiatric: Anxious. ____________________________________________    LABS (pertinent positives/negatives)  Labs Reviewed  BASIC METABOLIC PANEL - Abnormal; Notable for the following:       Result Value   Glucose, Bld 119 (*)    Creatinine, Ser 1.34 (*)    All other components within normal limits  CBC  TROPONIN I  ETHANOL  URINE DRUG SCREEN, QUALITATIVE ([facility] ONLY)     ____________________________________________   EKG  I, Phineas SemenGraydon Emiline Mancebo, attending physician, personally viewed and interpreted this EKG  EKG Time: 1622 Rate: 76 Rhythm: normal sinus rhythm Axis: normal Intervals: qtc 393 QRS: narrow ST changes: no st elevation Impression: normal ekg   ____________________________________________    RADIOLOGY  CXR IMPRESSION: Normal chest  ____________________________________________   PROCEDURES  Procedures  ____________________________________________   INITIAL IMPRESSION / ASSESSMENT AND PLAN / ED COURSE  Pertinent labs & imaging results that were available during my care of the patient were reviewed by me and considered in my medical decision making (see chart for details).  Patient  presents to the emergency department today because of concerns for chest pain and anxiety. On exam patient was pacing around the room. He was quite anxious. Terms of the chest pain chest x-ray, EKG and blood work without concerning findings. Patient did get an Ativan here in the emergency department. After this he was able to sit down and was calmer. Discussed importance of follow-up with [facility] or [facility] with patient.  ____________________________________________   FINAL CLINICAL IMPRESSION(S) / ED DIAGNOSES  Final diagnoses:  Anxiety     Note: This dictation was prepared with Dragon dictation. Any transcriptional errors that result from this process are unintentional     Phineas SemenGraydon Demetrice Amstutz, MD 12/20/16 [phone]

## 2016-12-20 NOTE — ED Triage Notes (Addendum)
Pt arrives to ER via POV c/o anxiety attack that happened earlier this AM. PT hx of anxiety attack X 1 previously. Denies SI or HI. Pt does not take any mediations for anxiety. Pt alert and oriented X4, active, cooperative, pt in NAD. RR even and unlabored, color WNL.  Pt drove self to ER.  CP since anxiety started, continued CP now.

## 2016-12-20 NOTE — Discharge Instructions (Signed)
Please seek medical attention for any high fevers, chest pain, shortness of breath, change in behavior, persistent vomiting, bloody stool or any other new or concerning symptoms.  

## 2017-04-15 ENCOUNTER — Emergency Department
Admission: EM | Admit: 2017-04-15 | Discharge: 2017-04-15 | Disposition: A | Discharge status: Home / Self Care | Payer: Self-pay | Attending: Emergency Medicine | Admitting: Emergency Medicine

## 2017-04-15 ENCOUNTER — Encounter: Payer: Self-pay | Admitting: Emergency Medicine

## 2017-04-15 DIAGNOSIS — Z79899 Other long term (current) drug therapy: Secondary | ICD-10-CM | POA: Insufficient documentation

## 2017-04-15 DIAGNOSIS — Z791 Long term (current) use of non-steroidal anti-inflammatories (NSAID): Secondary | ICD-10-CM | POA: Insufficient documentation

## 2017-04-15 DIAGNOSIS — Y939 Activity, unspecified: Secondary | ICD-10-CM | POA: Insufficient documentation

## 2017-04-15 DIAGNOSIS — F17210 Nicotine dependence, cigarettes, uncomplicated: Secondary | ICD-10-CM | POA: Insufficient documentation

## 2017-04-15 DIAGNOSIS — B851 Pediculosis due to Pediculus humanus corporis: Secondary | ICD-10-CM | POA: Insufficient documentation

## 2017-04-15 DIAGNOSIS — W57XXXA Bitten or stung by nonvenomous insect and other nonvenomous arthropods, initial encounter: Secondary | ICD-10-CM | POA: Insufficient documentation

## 2017-04-15 DIAGNOSIS — Y929 Unspecified place or not applicable: Secondary | ICD-10-CM | POA: Insufficient documentation

## 2017-04-15 DIAGNOSIS — Y999 Unspecified external cause status: Secondary | ICD-10-CM | POA: Insufficient documentation

## 2017-04-15 MED ORDER — PERMETHRIN 5 % EX CREA: TOPICAL_CREAM | CUTANEOUS | 1 refills | Status: DC

## 2017-04-15 MED ORDER — HYDROXYZINE HCL 25 MG PO TABS: 25.0000 mg | ORAL_TABLET | Freq: Three times a day (TID) | ORAL | 0 refills | Status: DC | PRN

## 2017-04-15 NOTE — ED Notes (Signed)
See triage note   States he developed a rash/bug bites to his entire body 2 days ago  Positive itchy

## 2017-04-15 NOTE — ED Provider Notes (Signed)
[facility] Emergency Department Provider Note  ____________________________________________  Time seen: Approximately 9:01 AM  I have reviewed the triage vital signs and the nursing notes.   HISTORY  Chief Complaint Insect Bite   HPI Scott George is a 31 y.o. male who presents to the emergency department for evaluation of pruritic rash and but bites over his entire body that he noticed 2 days ago. Itching increases at night. Patient states that he believes the bites are due to "body lice." He denies any others with similar symptoms in his home. He has not used any medications or creams for itching. He reports seeing "live bugs" and brought 1 in with him.  History reviewed. No pertinent past medical history.  There are no active problems to display for this patient.   Past Surgical History:  Procedure Laterality Date  . CYST REMOVAL NECK    . TONSILLECTOMY      Prior to Admission medications   Medication Sig Start Date End Date Taking? Authorizing Provider  diphenhydrAMINE (BENADRYL) 25 mg capsule Take 1 capsule (25 mg total) by mouth every 4 (four) hours as needed. Patient taking differently: Take 25 mg by mouth every 4 (four) hours as needed for itching or allergies.  10/22/15 10/21/16  Jene Every, MD  HYDROcodone-acetaminophen (NORCO/VICODIN) 5-325 MG tablet Take 1-2 tablets by mouth every 6 (six) hours as needed for moderate pain. 12/25/15   Governor Rooks, MD  hydrocortisone (ANUSOL-HC) 2.5 % rectal cream Place 1 application rectally 2 (two) times daily. Patient not taking: Reported on 11/04/2015 08/04/15   Emily Filbert, MD  hydrOXYzine (ATARAX/VISTARIL) 25 MG tablet Take 1 tablet (25 mg total) by mouth 3 (three) times daily as needed. 04/15/17   Fraser Busche B, FNP  ibuprofen (ADVIL,MOTRIN) 200 MG tablet Take 800 mg by mouth every 6 (six) hours as needed for mild pain or moderate pain.     [provider]  ketorolac (TORADOL) 10  MG tablet Take 1 tablet (10 mg total) by mouth every 6 (six) hours as needed. 06/08/16   Jennye Moccasin, MD  metoCLOPramide (REGLAN) 10 MG tablet Take 1 tablet (10 mg total) by mouth every 8 (eight) hours as needed for nausea. 06/08/16 06/15/16  Jennye Moccasin, MD  ondansetron (ZOFRAN) 4 MG tablet Take 1 tablet (4 mg total) by mouth every 8 (eight) hours as needed for nausea or vomiting. 12/25/15   Governor Rooks, MD  oxyCODONE-acetaminophen (ROXICET) 5-325 MG tablet Take 1-2 tablets by mouth every 6 (six) hours as needed. 11/04/15   Myrna Blazer, MD  permethrin (ELIMITE) 5 % cream Apply from head to soles of feet except around eyes, leave on for 10 hours then shower off. 04/15/17 04/15/18  Adonia Porada, Rulon Eisenmenger B, FNP  predniSONE (DELTASONE) 50 MG tablet Take 1 tablet (50 mg total) by mouth daily with breakfast. Patient not taking: Reported on 11/04/2015 10/22/15   Jene Every, MD  tamsulosin (FLOMAX) 0.4 MG CAPS capsule Take 1 capsule (0.4 mg total) by mouth daily. 12/25/15   Governor Rooks, MD    Allergies Patient has no known allergies.  No family history on file.  Social History Social History  Substance Use Topics  . Smoking status: Current Every Day Smoker    Packs/day: 0.50    Types: Cigarettes  . Smokeless tobacco: Never Used  . Alcohol use No    Review of Systems  Constitutional: Unkempt in appearance.  Respiratory: Negative for shortness of breath or cough.  Musculoskeletal:  Negative for bodyaches  Skin: Positive for pruritic rash Neurological: Negative for paresthesias ____________________________________________   PHYSICAL EXAM:  VITAL SIGNS: ED Triage Vitals  Enc Vitals Group     BP 04/15/17 0725 (!) 147/84     Pulse Rate 04/15/17 0725 98     Resp 04/15/17 0725 16     Temp 04/15/17 0725 98.4 F (36.9 C)     Temp Source 04/15/17 0725 Oral     SpO2 04/15/17 0725 98 %     Weight 04/15/17 0725 185 lb (83.9 kg)     Height 04/15/17 0725 6\' 4"  (1.93 m)      Head Circumference --      Peak Flow --      Pain Score 04/15/17 0724 0     Pain Loc --      Pain Edu? --      Excl. in GC? --      Constitutional: Unkempt Eyes: Conjunctiva clear without drainage or erythema Nose: No rhinorrhea Mouth/Throat: Airway patent, oropharynx and buccal surfaces normal without lesions. Neck: No stridor. No meningismus  Respiratory: Respiratory rate even and unlabored. Musculoskeletal: Full range of motion throughout Neurologic: Alert and oriented. GCS 15. Skin:  Erythematous, maculopapular annular lesions present over the extremities and trunk with appearance of 2 live bugs on inside of t-shirt sleeve.  ____________________________________________   LABS (all labs ordered are listed, but only abnormal results are displayed)  Labs Reviewed - No data to display ____________________________________________  EKG   ____________________________________________  RADIOLOGY   ____________________________________________   PROCEDURES  Procedure(s) performed: None ____________________________________________   INITIAL IMPRESSION / ASSESSMENT AND PLAN / ED COURSE  Scott George is a 31 y.o. male who presents emergency department for evaluation and treatment of body lice. He is given a prescription for permethrin and specific instructions on how to use it. He was instructed to also take the hydroxyzine for itching as directed if needed. He was advised to follow-up with the primary care provider for his choice for symptoms that are not improving over the next couple weeks.   Pertinent labs & imaging results that were available during my care of the patient were reviewed by me and considered in my medical decision making (see chart for details). ____________________________________________   FINAL CLINICAL IMPRESSION(S) / ED DIAGNOSES  Final diagnoses:  Pediculosis corporis    Discharge Medication List as of 04/15/2017  8:22 AM    START  taking these medications   Details  hydrOXYzine (ATARAX/VISTARIL) 25 MG tablet Take 1 tablet (25 mg total) by mouth 3 (three) times daily as needed., Starting Sun 04/15/2017, Print    permethrin (ELIMITE) 5 % cream Apply from head to soles of feet except around eyes, leave on for 10 hours then shower off., Print        If controlled substance prescribed during this visit, 12 month history viewed on the [facility] prior to issuing an initial prescription for Schedule II or III opiod.   Note:  This document was prepared using Dragon voice recognition software and may include unintentional dictation errors.    Chinita Pesterriplett, Neriah Brott B, FNP 04/15/17 [id number]    Jeanmarie PlantMcShane, James A, MD 04/15/17 [phone]

## 2017-04-15 NOTE — Discharge Instructions (Signed)
Read over the discharge papers and follow the instructions on how to properly clean to get rid of the lice.

## 2017-04-15 NOTE — ED Triage Notes (Signed)
Pt to ED via POV, pt states that he noticed that 2 days ago he noticed that he had bites on him, yesterday he found a bug, states that he looked it up online and it said it was body lice. Pt in NAD at this time. Multiple bites noted on left arm. Pt has bug in container with him.

## 2018-07-29 ENCOUNTER — Emergency Department
Admission: EM | Admit: 2018-07-29 | Discharge: 2018-07-29 | Disposition: A | Discharge status: Home / Self Care | Payer: Self-pay | Attending: Emergency Medicine | Admitting: Emergency Medicine

## 2018-07-29 ENCOUNTER — Encounter: Payer: Self-pay | Admitting: Emergency Medicine

## 2018-07-29 ENCOUNTER — Emergency Department: Payer: Self-pay

## 2018-07-29 ENCOUNTER — Other Ambulatory Visit: Payer: Self-pay

## 2018-07-29 DIAGNOSIS — F17210 Nicotine dependence, cigarettes, uncomplicated: Secondary | ICD-10-CM | POA: Insufficient documentation

## 2018-07-29 DIAGNOSIS — L03211 Cellulitis of face: Secondary | ICD-10-CM | POA: Insufficient documentation

## 2018-07-29 DIAGNOSIS — H109 Unspecified conjunctivitis: Secondary | ICD-10-CM | POA: Insufficient documentation

## 2018-07-29 DIAGNOSIS — Z79899 Other long term (current) drug therapy: Secondary | ICD-10-CM | POA: Insufficient documentation

## 2018-07-29 LAB — BASIC METABOLIC PANEL
ANION GAP: 10 (ref 5–15)
BUN: 20 mg/dL (ref 6–20)
CALCIUM: 9.6 mg/dL (ref 8.9–10.3)
CO2: 27 mmol/L (ref 22–32)
Chloride: 105 mmol/L (ref 98–111)
Creatinine, Ser: 1.23 mg/dL (ref 0.61–1.24)
GFR calc Af Amer: >60 mL/min (ref >60)
Glucose, Bld: 98 mg/dL (ref 70–99)
Potassium: 4 mmol/L (ref 3.5–5.1)
SODIUM: 142 mmol/L (ref 135–145)

## 2018-07-29 LAB — CBC WITH DIFFERENTIAL/PLATELET
BASOS ABS: 0 10*3/uL (ref 0–0.1)
Basophils Relative: 1 %
EOS PCT: 2 %
Eosinophils Absolute: 0.1 10*3/uL (ref 0–0.7)
HCT: 43.5 % (ref 40.0–52.0)
Hemoglobin: 15 g/dL (ref 13.0–18.0)
Lymphocytes Relative: 25 %
Lymphs Abs: 1.6 10*3/uL (ref 1.0–3.6)
MCH: 30.6 pg (ref 26.0–34.0)
MCHC: 34.6 g/dL (ref 32.0–36.0)
MCV: 88.7 fL (ref 80.0–100.0)
MONO ABS: 0.6 10*3/uL (ref 0.2–1.0)
Monocytes Relative: 10 %
Neutro Abs: 4 10*3/uL (ref 1.4–6.5)
Neutrophils Relative %: 62 %
PLATELETS: 212 10*3/uL (ref 150–440)
RBC: 4.91 MIL/uL (ref 4.40–5.90)
RDW: 13.2 % (ref 11.5–14.5)
WBC: 6.4 10*3/uL (ref 3.8–10.6)

## 2018-07-29 MED ORDER — ERYTHROMYCIN 5 MG/GM OP OINT: 1.0000 "application " | TOPICAL_OINTMENT | Freq: Four times a day (QID) | OPHTHALMIC | 0 refills | Status: AC

## 2018-07-29 MED ORDER — ERYTHROMYCIN 5 MG/GM OP OINT: 1.0000 "application " | TOPICAL_OINTMENT | Freq: Four times a day (QID) | OPHTHALMIC | 0 refills | Status: DC

## 2018-07-29 MED ORDER — ACETAMINOPHEN 325 MG PO TABS
650.0000 mg | ORAL_TABLET | Freq: Once | ORAL | Status: AC
  Administered 2018-07-29: 650 mg via ORAL
  Filled 2018-07-29: qty 2

## 2018-07-29 MED ORDER — IOHEXOL 300 MG/ML  SOLN
75.0000 mL | Freq: Once | INTRAMUSCULAR | Status: AC | PRN
  Administered 2018-07-29: 75 mL via INTRAVENOUS

## 2018-07-29 MED ORDER — ERYTHROMYCIN 5 MG/GM OP OINT
TOPICAL_OINTMENT | Freq: Once | OPHTHALMIC | Status: AC
  Administered 2018-07-29: 1 via OPHTHALMIC
  Filled 2018-07-29: qty 1

## 2018-07-29 MED ORDER — SULFAMETHOXAZOLE-TRIMETHOPRIM 800-160 MG PO TABS: 1.0000 | ORAL_TABLET | Freq: Two times a day (BID) | ORAL | 0 refills | Status: AC

## 2018-07-29 NOTE — ED Triage Notes (Signed)
Here for vision changes to right eye.  Pain to right eye.  Swelling/mild redness to right periorbital. Ear pain since eye infection started.

## 2018-07-29 NOTE — Discharge Instructions (Addendum)
You are evaluated for eye drainage, redness, swelling and are being treated for possible bacterial infection called conjunctivitis with antibiotic ointment for the eye as well as possible skin infection called cellulitis to the face with oral antibiotic called Bactrim.  Return to the emerge department immediately for any worsening condition including any changes in vision, worsening swelling or redness, any fever, any pain with movement of the eyes, headache, confusion or altered mental status, or any other symptoms concerning to you.  Primary care office Phineas Real office number was provided, please make a follow-up appoint within 1 week.  If you have any worsening visual symptoms certainly come back to the emergency department and consider ophthalmologist evaluation, Dr.Porfilio's office number is provided.

## 2018-07-29 NOTE — ED Provider Notes (Signed)
[facility] Emergency Department Provider Note ____________________________________________   I have reviewed the triage vital signs and the triage nursing note.  HISTORY  Chief Complaint Eye Problem   Historian Patient  HPI Scott George is a 32 y.o. male presenting for right eye pain and swelling.  States it started several days ago and is been getting worse.  There is a lot of eye drainage.  He states this morning he noticed that he was having some blurry vision in his peripheral vision.  States that if he looks straight ahead there is no blurry vision.  No fevers.  He has some soreness into his right ear.  No skin rash, but there is redness about the right periorbital area as well as some periorbital swelling.  No trauma or foreign body by history or sensation.  Pain is mild to moderate.  He took ibuprofen this morning.     History reviewed. No pertinent past medical history.  There are no active problems to display for this patient.   Past Surgical History:  Procedure Laterality Date  . CYST REMOVAL NECK    . TONSILLECTOMY      Prior to Admission medications   Medication Sig Start Date End Date Taking? Authorizing Provider  diphenhydrAMINE (BENADRYL) 25 mg capsule Take 1 capsule (25 mg total) by mouth every 4 (four) hours as needed. Patient taking differently: Take 25 mg by mouth every 4 (four) hours as needed for itching or allergies.  10/22/15 10/21/16  Jene Every, MD  erythromycin ophthalmic ointment Place 1 application into the right eye 4 (four) times daily for 7 days. 07/29/18 08/05/18  Governor Rooks, MD  HYDROcodone-acetaminophen (NORCO/VICODIN) 5-325 MG tablet Take 1-2 tablets by mouth every 6 (six) hours as needed for moderate pain. 12/25/15   Governor Rooks, MD  hydrocortisone (ANUSOL-HC) 2.5 % rectal cream Place 1 application rectally 2 (two) times daily. Patient not taking: Reported on 11/04/2015 08/04/15   Emily Filbert, MD   hydrOXYzine (ATARAX/VISTARIL) 25 MG tablet Take 1 tablet (25 mg total) by mouth 3 (three) times daily as needed. 04/15/17   Triplett, Cari B, FNP  ibuprofen (ADVIL,MOTRIN) 200 MG tablet Take 800 mg by mouth every 6 (six) hours as needed for mild pain or moderate pain.     [provider]  ketorolac (TORADOL) 10 MG tablet Take 1 tablet (10 mg total) by mouth every 6 (six) hours as needed. 06/08/16   Jennye Moccasin, MD  metoCLOPramide (REGLAN) 10 MG tablet Take 1 tablet (10 mg total) by mouth every 8 (eight) hours as needed for nausea. 06/08/16 06/15/16  Jennye Moccasin, MD  ondansetron (ZOFRAN) 4 MG tablet Take 1 tablet (4 mg total) by mouth every 8 (eight) hours as needed for nausea or vomiting. 12/25/15   Governor Rooks, MD  oxyCODONE-acetaminophen (ROXICET) 5-325 MG tablet Take 1-2 tablets by mouth every 6 (six) hours as needed. 11/04/15   Myrna Blazer, MD  permethrin (ELIMITE) 5 % cream Apply from head to soles of feet except around eyes, leave on for 10 hours then shower off. 04/15/17 04/15/18  Triplett, Rulon Eisenmenger B, FNP  predniSONE (DELTASONE) 50 MG tablet Take 1 tablet (50 mg total) by mouth daily with breakfast. Patient not taking: Reported on 11/04/2015 10/22/15   Jene Every, MD  sulfamethoxazole-trimethoprim (BACTRIM DS,SEPTRA DS) 800-160 MG tablet Take 1 tablet by mouth 2 (two) times daily for 10 days. 07/29/18 08/08/18  Governor Rooks, MD  tamsulosin (FLOMAX) 0.4 MG CAPS capsule  Take 1 capsule (0.4 mg total) by mouth daily. 12/25/15   Governor Rooks, MD    No Known Allergies  History reviewed. No pertinent family history.  Social History Social History   Tobacco Use  . Smoking status: Current Every Day Smoker    Packs/day: 0.50    Types: Cigarettes  . Smokeless tobacco: Never Used  Substance Use Topics  . Alcohol use: No  . Drug use: No    Review of Systems  Constitutional: Negative for fever. Eyes: As per HPI ENT: Negative for sore throat. Cardiovascular:  Negative for chest pain. Respiratory: Negative for shortness of breath. Gastrointestinal: Negative for abdominal pain, vomiting and diarrhea. Genitourinary: Negative for dysuria. Musculoskeletal: Negative for back pain. Skin: Negative for rash. Neurological: Negative for headache.  ____________________________________________   PHYSICAL EXAM:  VITAL SIGNS: ED Triage Vitals  Enc Vitals Group     BP 07/29/18 1053 (!) 134/91     Pulse Rate 07/29/18 1053 91     Resp 07/29/18 1053 16     Temp 07/29/18 1053 98.5 F (36.9 C)     Temp Source 07/29/18 1053 Oral     SpO2 07/29/18 1053 100 %     Weight 07/29/18 1048 195 lb (88.5 kg)     Height 07/29/18 1048 6\' 4"  (1.93 m)     Head Circumference --      Peak Flow --      Pain Score 07/29/18 1048 5     Pain Loc --      Pain Edu? --      Excl. in GC? --      Constitutional: Alert and oriented.  HEENT      Head: Normocephalic and atraumatic.      Eyes: Mild conjunctivitis on the right.  Pupils equal round and reactive bilaterally.  Extraocular movements intact.  No pain with extra ocular movements.  Reports mild blurry vision without visual field deficit the right lateral gaze on the right.  Periorbital swelling mild to moderate with erythema.      Ears:   Normal external appearance.        Nose: No congestion/rhinnorhea.      Mouth/Throat: Mucous membranes are moist.      Neck: No stridor. Cardiovascular/Chest: Normal rate, regular rhythm.  No murmurs, rubs, or gallops. Respiratory: Normal respiratory effort without tachypnea nor retractions. Breath sounds are clear and equal bilaterally. No wheezes/rales/rhonchi. Gastrointestinal:  Genitourinary/rectal:Deferred Musculoskeletal: Nontender with normal range of motion in all extremities. Neurologic:  Normal speech and language. No gross or focal neurologic deficits are appreciated. Skin:  Skin is warm, dry and intact. No rash noted. Psychiatric: Mood and affect are normal. Speech and  behavior are normal. Patient exhibits appropriate insight and judgment.   ____________________________________________  LABS (pertinent positives/negatives) I, Governor Rooks, MD the attending physician have reviewed the labs noted below.  Labs Reviewed  BASIC METABOLIC PANEL  CBC WITH DIFFERENTIAL/PLATELET    ____________________________________________    EKG I, Governor Rooks, MD, the attending physician have personally viewed and interpreted all ECGs.  None ____________________________________________  RADIOLOGY   CT orbits with contrast: Reviewed radiologist interpretation of no evidence for orbital cellulitis. __________________________________________  PROCEDURES  Procedure(s) performed: None  Procedures  Critical Care performed: None   ____________________________________________  ED COURSE / ASSESSMENT AND PLAN  Pertinent labs & imaging results that were available during my care of the patient were reviewed by me and considered in my medical decision making (see chart for details).  Unilateral eye swelling and eye discomfort, concerning for conjunctivitis plus facial cellulitis versus preseptal versus orbital cellulitis.    CT reassuring.  We will discharge home recommending PCP follow-up, as well as provide ophthalmology office number if he has any continued vision changes.    CONSULTATIONS:   None   Patient / Family / Caregiver informed of clinical course, medical decision-making process, and agree with plan.   I discussed return precautions, follow-up instructions, and discharge instructions with patient and/or family.  Discharge Instructions : You are evaluated for eye drainage, redness, swelling and are being treated for possible bacterial infection called conjunctivitis with antibiotic ointment for the eye as well as possible skin infection called cellulitis to the face with oral antibiotic called Bactrim.  Return to the emerge department  immediately for any worsening condition including any changes in vision, worsening swelling or redness, any fever, any pain with movement of the eyes, headache, confusion or altered mental status, or any other symptoms concerning to you.  Primary care office [facility] office number was provided, please make a follow-up appoint within 1 week.  If you have any worsening visual symptoms certainly come back to the emergency department and consider ophthalmologist evaluation, Dr.Porfilio's office number is provided.    ___________________________________________   FINAL CLINICAL IMPRESSION(S) / ED DIAGNOSES   Final diagnoses:  Conjunctivitis of right eye, unspecified conjunctivitis type  Facial cellulitis      ___________________________________________         Note: This dictation was prepared with Dragon dictation. Any transcriptional errors that result from this process are unintentional    Governor Rooks, MD 07/29/18 1257

## 2019-06-05 ENCOUNTER — Emergency Department
Admission: EM | Admit: 2019-06-05 | Discharge: 2019-06-05 | Disposition: A | Discharge status: Home / Self Care | Payer: Self-pay | Attending: Emergency Medicine | Admitting: Emergency Medicine

## 2019-06-05 ENCOUNTER — Encounter: Payer: Self-pay | Admitting: Emergency Medicine

## 2019-06-05 ENCOUNTER — Emergency Department: Payer: Self-pay

## 2019-06-05 ENCOUNTER — Other Ambulatory Visit: Payer: Self-pay

## 2019-06-05 DIAGNOSIS — F17210 Nicotine dependence, cigarettes, uncomplicated: Secondary | ICD-10-CM | POA: Insufficient documentation

## 2019-06-05 DIAGNOSIS — J069 Acute upper respiratory infection, unspecified: Secondary | ICD-10-CM | POA: Insufficient documentation

## 2019-06-05 DIAGNOSIS — Z20828 Contact with and (suspected) exposure to other viral communicable diseases: Secondary | ICD-10-CM | POA: Insufficient documentation

## 2019-06-05 DIAGNOSIS — R0981 Nasal congestion: Secondary | ICD-10-CM | POA: Insufficient documentation

## 2019-06-05 MED ORDER — AZITHROMYCIN 250 MG PO TABS: ORAL_TABLET | ORAL | 0 refills | Status: DC

## 2019-06-05 MED ORDER — BENZONATATE 100 MG PO CAPS: 100.0000 mg | ORAL_CAPSULE | Freq: Three times a day (TID) | ORAL | 0 refills | Status: DC | PRN

## 2019-06-05 NOTE — ED Triage Notes (Signed)
Says cough and cold sx for about a week.  Says unsure of fever.

## 2019-06-05 NOTE — ED Provider Notes (Signed)
[facility] Emergency Department Provider Note  ____________________________________________  Time seen: Approximately 2:34 PM  I have reviewed the triage vital signs and the nursing notes.   HISTORY  Chief Complaint Cough and Nasal Congestion    HPI CONG Scott George is a 33 y.o. male that presents to the emergency department for evaluation of nasal congestion and productive cough with yellow sputum for 8 days.  He has had a couple episodes of diarrhea.  Patient states that symptoms do seem to be improving.  He smokes cigarettes daily.  No sick contacts.  He came to the emergency department because he needs a note to return to work.  No fever, shortness of breath, chest pain, vomiting, abdominal pain.   History reviewed. No pertinent past medical history.  There are no active problems to display for this patient.   Past Surgical History:  Procedure Laterality Date  . CYST REMOVAL NECK    . TONSILLECTOMY      Prior to Admission medications   Medication Sig Start Date End Date Taking? Authorizing Provider  azithromycin (ZITHROMAX Z-PAK) 250 MG tablet Take 2 tablets (500 mg) on  Day 1,  followed by 1 tablet (250 mg) once daily on Days 2 through 5. 06/05/19   Laban Emperor, PA-C  benzonatate (TESSALON PERLES) 100 MG capsule Take 1 capsule (100 mg total) by mouth 3 (three) times daily as needed. 06/05/19 06/04/20  Laban Emperor, PA-C  ibuprofen (ADVIL,MOTRIN) 200 MG tablet Take 800 mg by mouth every 6 (six) hours as needed for mild pain or moderate pain.     [provider]    Allergies Patient has no known allergies.  No family history on file.  Social History Social History   Tobacco Use  . Smoking status: Current Every Day Smoker    Packs/day: 0.50    Types: Cigarettes  . Smokeless tobacco: Never Used  Substance Use Topics  . Alcohol use: No  . Drug use: No     Review of Systems  Constitutional: No fever/chills Eyes: No visual  changes. No discharge. ENT: Positive for congestion and rhinorrhea. Cardiovascular: No chest pain. Respiratory: Positive for cough. No SOB. Gastrointestinal: No abdominal pain.  No nausea, no vomiting.  Positive for diarrhea.  No constipation. Musculoskeletal: Negative for musculoskeletal pain. Skin: Negative for rash, abrasions, lacerations, ecchymosis. Neurological: Negative for headaches.   ____________________________________________   PHYSICAL EXAM:  VITAL SIGNS: ED Triage Vitals [06/05/19 1220]  Enc Vitals Group     BP 130/77     Pulse Rate 100     Resp 16     Temp 98.2 F (36.8 C)     Temp src      SpO2 98 %     Weight      Height      Head Circumference      Peak Flow      Pain Score      Pain Loc      Pain Edu?      Excl. in [place]?      Constitutional: Alert and oriented. Well appearing and in no acute distress. Eyes: Conjunctivae are normal. PERRL. EOMI. No discharge. Head: Atraumatic. ENT: No frontal and maxillary sinus tenderness.      Ears: Tympanic membranes pearly gray with good landmarks. No discharge.      Nose: Mild congestion/rhinnorhea.      Mouth/Throat: Mucous membranes are moist. Oropharynx non-erythematous. Tonsils not enlarged. No exudates. Uvula midline. Neck: No stridor.  Hematological/Lymphatic/Immunilogical: No cervical lymphadenopathy. Cardiovascular: Normal rate, regular rhythm.  Good peripheral circulation. Respiratory: Normal respiratory effort without tachypnea or retractions. Lungs CTAB. Good air entry to the bases with no decreased or absent breath sounds. Gastrointestinal: Bowel sounds 4 quadrants. Soft and nontender to palpation. No guarding or rigidity. No palpable masses. No distention. Musculoskeletal: Full range of motion to all extremities. No gross deformities appreciated. Neurologic:  Normal speech and language. No gross focal neurologic deficits are appreciated.  Skin:  Skin is warm, dry and intact. No rash  noted. Psychiatric: Mood and affect are normal. Speech and behavior are normal. Patient exhibits appropriate insight and judgement.   ____________________________________________   LABS (all labs ordered are listed, but only abnormal results are displayed)  Labs Reviewed  NOVEL CORONAVIRUS, NAA (HOSPITAL ORDER, SEND-OUT TO REF LAB)   ____________________________________________  EKG   ____________________________________________  RADIOLOGY Lexine BatonI, [place], personally viewed and evaluated these images (plain radiographs) as part of my medical decision making, as well as reviewing the written report by the radiologist.  Dg Chest Portable 1 View  Result Date: 06/05/2019 CLINICAL DATA:  States he has had cough for several days States cough is prod Yellowish phlegm Denies any fever but states he has had cold sx's and congestion. Current everyday smoker EXAM: PORTABLE CHEST 1 VIEW COMPARISON:  12/20/2016 FINDINGS: The heart size and mediastinal contours are within normal limits. Both lungs are clear. The visualized skeletal structures are unremarkable. IMPRESSION: No active disease. Electronically Signed   By: Elige KoHetal  Patel   On: 06/05/2019 13:32    ____________________________________________    PROCEDURES  Procedure(s) performed:    Procedures    Medications - No data to display   ____________________________________________   INITIAL IMPRESSION / ASSESSMENT AND PLAN / ED COURSE  Pertinent labs & imaging results that were available during my care of the patient were reviewed by me and considered in my medical decision making (see chart for details).  Review of the [place] CSRS was performed in accordance of the [facility] prior to dispensing any controlled drugs.    Patient's diagnosis is consistent with URI. Vital signs and exam are reassuring. Chest xray negative for acute cardiopulmonary processes. Covid test is pending. Patient appears well and is staying well hydrated.  Patient should alternate tylenol and ibuprofen for fever. Patient feels comfortable going home. Patient will be covered for bacterial infection since his symptoms have been present for >1 week. Patient will be discharged home with prescriptions for azithromycin and tessalon perles. Patient is to follow up with PCP as needed or otherwise directed. Patient is given ED precautions to return to the ED for any worsening or new symptoms.   Golden Hurterhomas J Minnich was evaluated in Emergency Department on 06/05/2019 for the symptoms described in the history of present illness. He was evaluated in the context of the global COVID-19 pandemic, which necessitated consideration that the patient might be at risk for infection with the SARS-CoV-2 virus that causes COVID-19. Institutional protocols and algorithms that pertain to the evaluation of patients at risk for COVID-19 are in a state of rapid change based on information released by regulatory bodies including the CDC and federal and state organizations. These policies and algorithms were followed during the patient's care in the ED.  ____________________________________________  FINAL CLINICAL IMPRESSION(S) / ED DIAGNOSES  Final diagnoses:  Upper respiratory tract infection, unspecified type      NEW MEDICATIONS STARTED DURING THIS VISIT:  ED Discharge Orders  Ordered    azithromycin (ZITHROMAX Z-PAK) 250 MG tablet    Note to Pharmacy: Bronchitis   06/05/19 1432    benzonatate (TESSALON PERLES) 100 MG capsule  3 times daily PRN     06/05/19 1433              This chart was dictated using voice recognition software/Dragon. Despite best efforts to proofread, errors can occur which can change the meaning. Any change was purely unintentional.    Enid DerryWagner, Adriell Polansky, PA-C 06/05/19 1958    Emily FilbertWilliams, Jonathan E, MD 06/07/19 [phone]

## 2019-06-05 NOTE — ED Notes (Signed)
See triage note. States he has had cough for several days  States cough is prod    Yellowish phlegm  Denies any fever but states he has had cold sx's and congestion   Also states he needs a work note

## 2019-06-06 LAB — NOVEL CORONAVIRUS, NAA (HOSP ORDER, SEND-OUT TO REF LAB; TAT 18-24 HRS): SARS-CoV-2, NAA: NOT DETECTED

## 2019-06-09 ENCOUNTER — Telehealth: Payer: Self-pay | Admitting: Emergency Medicine

## 2019-06-09 NOTE — Telephone Encounter (Addendum)
Called pateint to infrom of negative covid result.  Left message.  Patient called me back and I gave negative result.

## 2019-10-25 ENCOUNTER — Other Ambulatory Visit: Payer: Self-pay

## 2019-10-25 ENCOUNTER — Emergency Department: Payer: Self-pay

## 2019-10-25 ENCOUNTER — Emergency Department
Admission: EM | Admit: 2019-10-25 | Discharge: 2019-10-25 | Disposition: A | Discharge status: Home / Self Care | Payer: Self-pay | Attending: Emergency Medicine | Admitting: Emergency Medicine

## 2019-10-25 ENCOUNTER — Encounter: Payer: Self-pay | Admitting: Intensive Care

## 2019-10-25 DIAGNOSIS — M79671 Pain in right foot: Secondary | ICD-10-CM | POA: Insufficient documentation

## 2019-10-25 DIAGNOSIS — F17210 Nicotine dependence, cigarettes, uncomplicated: Secondary | ICD-10-CM | POA: Insufficient documentation

## 2019-10-25 MED ORDER — KETOROLAC TROMETHAMINE 30 MG/ML IJ SOLN
30.0000 mg | Freq: Once | INTRAMUSCULAR | Status: AC
  Administered 2019-10-25: 11:00:00 30 mg via INTRAMUSCULAR
  Filled 2019-10-25: qty 1

## 2019-10-25 MED ORDER — NAPROXEN 500 MG PO TABS: 500.0000 mg | ORAL_TABLET | Freq: Two times a day (BID) | ORAL | 0 refills | Status: AC

## 2019-10-25 NOTE — ED Triage Notes (Signed)
Patient c/o right foot pain/numbness X2 weeks. Able to ambulate. Denies injury. Denies diabetic

## 2019-10-25 NOTE — ED Provider Notes (Signed)
[facility] Provider Note  ____________________________________________   First MD Initiated Contact with Patient 10/25/19 0945     (approximate)  I have reviewed the triage vital signs and the nursing notes.   HISTORY  Chief Complaint Foot Pain (right)   HPI George George is a 33 y.o. male presents to the ED with complaint of pain to his right foot for approximately 2 weeks.  Patient states there is been no history of injury.  He states that he is taken an occasional Tylenol and infrequently and Aleve without any relief. He also reports some "numbness" in his fifth metatarsal. Patient also reports that his shoes that he wears at work or half size larger than he needs. He also reports that he does not wear socks while wearing these shoes. He rates pain as an 8 out of 10.     History reviewed. No pertinent past medical history.  There are no active problems to display for this patient.   Past Surgical History:  Procedure Laterality Date  . CYST REMOVAL NECK    . TONSILLECTOMY      Prior to Admission medications   Medication Sig Start Date End Date Taking? Authorizing Provider  naproxen (NAPROSYN) 500 MG tablet Take 1 tablet (500 mg total) by mouth 2 (two) times daily with a meal. 10/25/19   Tommi Rumps, PA-C    Allergies Patient has no known allergies.  History reviewed. No pertinent family history.  Social History Social History   Tobacco Use  . Smoking status: Current Every Day Smoker    Packs/day: 0.50    Types: Cigarettes  . Smokeless tobacco: Never Used  Substance Use Topics  . Alcohol use: Yes    Comment: occ  . Drug use: Yes    Types: Marijuana    Review of Systems Constitutional: No fever/chills Cardiovascular: Denies chest pain. Respiratory: Denies shortness of breath. Musculoskeletal: Positive for right foot pain. Skin: Negative for rash. Neurological: Positive numbness at the right  fifth metatarsal.  ____________________________________________   PHYSICAL EXAM:  VITAL SIGNS: ED Triage Vitals  Enc Vitals Group     BP 10/25/19 0935 (!) 148/105     Pulse Rate 10/25/19 0935 (!) 102     Resp 10/25/19 0935 20     Temp 10/25/19 0935 97.7 F (36.5 C)     Temp Source 10/25/19 0935 Oral     SpO2 10/25/19 0935 100 %     Weight 10/25/19 0935 225 lb (102.1 kg)     Height 10/25/19 0935 6\' 1"  (1.854 m)     Head Circumference --      Peak Flow --      Pain Score 10/25/19 0942 8     Pain Loc --      Pain Edu? --      Excl. in [place]? --     Constitutional: Alert and oriented. Well appearing and in no acute distress. Eyes: Conjunctivae are normal.  Head: Atraumatic. Neck: No stridor.   Cardiovascular: Normal rate, regular rhythm. Grossly normal heart sounds.  Good peripheral circulation. Respiratory: Normal respiratory effort.  No retractions. Lungs CTAB. Musculoskeletal: On examination of the right foot there is no gross deformity noted there is however multiple callus formations on the digits including the great toe on the medial aspect. Skin is intact without puncture wound, erythema or drainage noted. Patient is markedly tender at the base of the right fifth digit. Area is slightly edematous and  questionably warm to touch. Patient was unable to allow provider to touch his foot for any length of time. Nontender bilateral malleolus. Neurologic:  Normal speech and language. No gross focal neurologic deficits are appreciated. No gait instability. Skin:  Skin is warm, dry and intact. Questionable erythema at the base of the right fifth digit. Psychiatric: Mood and affect are normal. Speech and behavior are normal.  ____________________________________________   LABS (all labs ordered are listed, but only abnormal results are displayed)  Labs Reviewed - No data to display  RADIOLOGY  Official radiology report(s): Dg Foot Complete Right  Result Date: 10/25/2019  CLINICAL DATA:  Pain for 2 weeks EXAM: RIGHT FOOT COMPLETE - 3+ VIEW COMPARISON:  None. FINDINGS: Frontal, oblique, and lateral views were obtained. There is no fracture or dislocation. Joint spaces appear normal. No erosive change. IMPRESSION: No fracture or dislocation.  No evident arthropathy. Electronically Signed   By: Lowella Grip III M.D.   On: 10/25/2019 10:12    ____________________________________________   PROCEDURES  Procedure(s) performed (including Critical Care):  Procedures  ____________________________________________   INITIAL IMPRESSION / ASSESSMENT AND PLAN / ED COURSE  As part of my medical decision making, I reviewed the following data within the electronic MEDICAL RECORD NUMBER Notes from prior ED visits and [place] Controlled Substance Database  33 year old male presents to the ED with complaint of right foot pain for the last 2 weeks. He denies any history of injury and states that he is infrequently taken over-the-counter medication for his pain. He states also that the lateral aspect of his foot feels "numb". No gross deformity was noted. There was an area of erythema noted at the base of the fifth toe that was slightly warm. There is multiple calluses on his feet. No puncture wound or suspicion of infection seen. X-rays were negative for acute injury. Patient was given Toradol 30 mg IM along with a prescription for naproxen 500 mg twice daily with food. He was told that he would need to take the naproxen every day for him to have an adequate dose of anti-inflammatories. He was given the name of the podiatrist that is on-call today for further evaluation if he continues to have problems.   ____________________________________________   FINAL CLINICAL IMPRESSION(S) / ED DIAGNOSES  Final diagnoses:  Foot pain, right     ED Discharge Orders         Ordered    naproxen (NAPROSYN) 500 MG tablet  2 times daily with meals     10/25/19 1037           Note:   This document was prepared using Dragon voice recognition software and may include unintentional dictation errors.    Johnn Hai, PA-C 10/25/19 1533    Lavonia Drafts, MD 10/26/19 [phone]

## 2019-10-25 NOTE — Discharge Instructions (Addendum)
Call make an appoint with Dr. Luana Shu who is the podiatrist on call today.  If you continue having problems with your foot you will need to be evaluated.  It is possible that you have some peripheral neuropathy which is inflammatory of the nerve in your foot.  Wear socks with your shoes for added protection.  Ice and elevate as needed for swelling.  You were given a Toradol shot while in the emergency department.  A prescription for naproxen 500 mg twice daily with food was sent to your pharmacy.  This will need to be taken every day until finished to help with the inflammation. Also [facility]  This is a free clinic there is eligible to you.  A good Rx card can be used for your medication to decrease the price.

## 2019-10-25 NOTE — ED Notes (Signed)
See triage note  Presents with pain and some swelling to lateral aspect to right foot   sxs' started about 2 weeks ago  Denies any injury   Good pulses

## 2020-01-17 IMAGING — DX DG FOOT COMPLETE 3+V*R*
3 series · 3 of 3 positions shown · non-contrast
Comparison: None.

CLINICAL DATA: Pain for 2 weeks

EXAM:
RIGHT FOOT COMPLETE - 3+ VIEW

[foot ap]
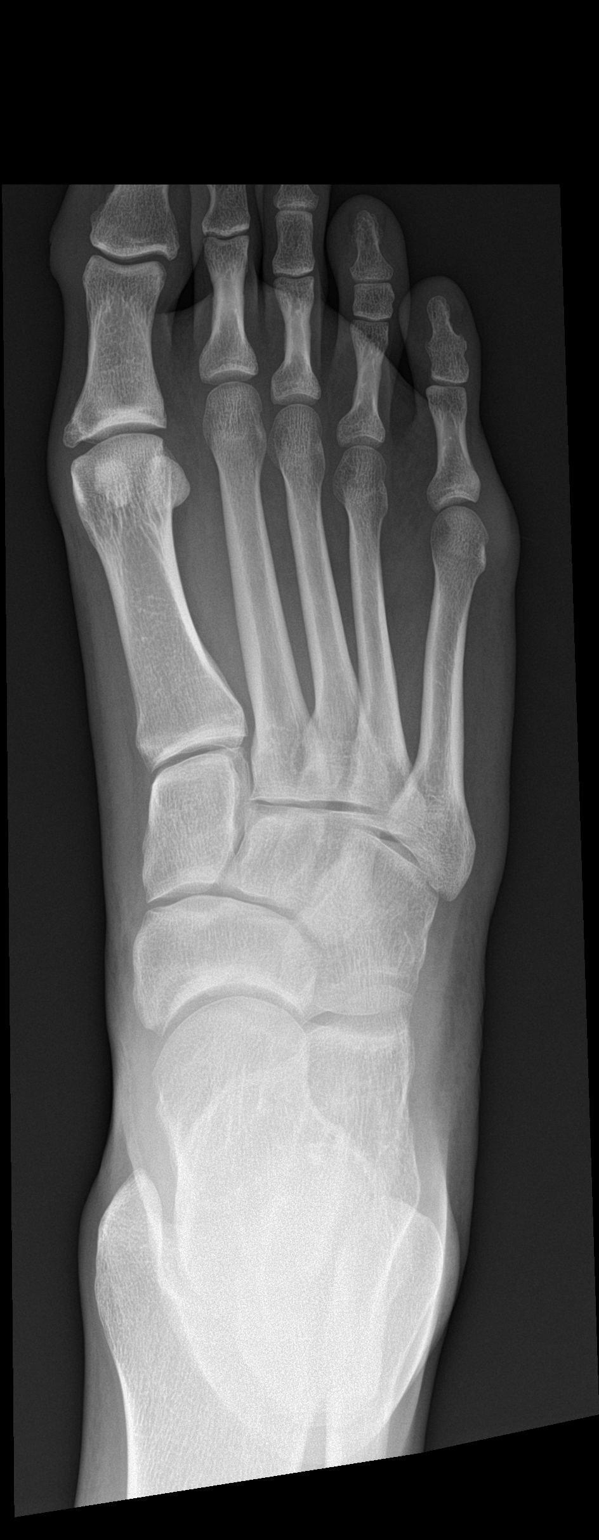

[foot obl]
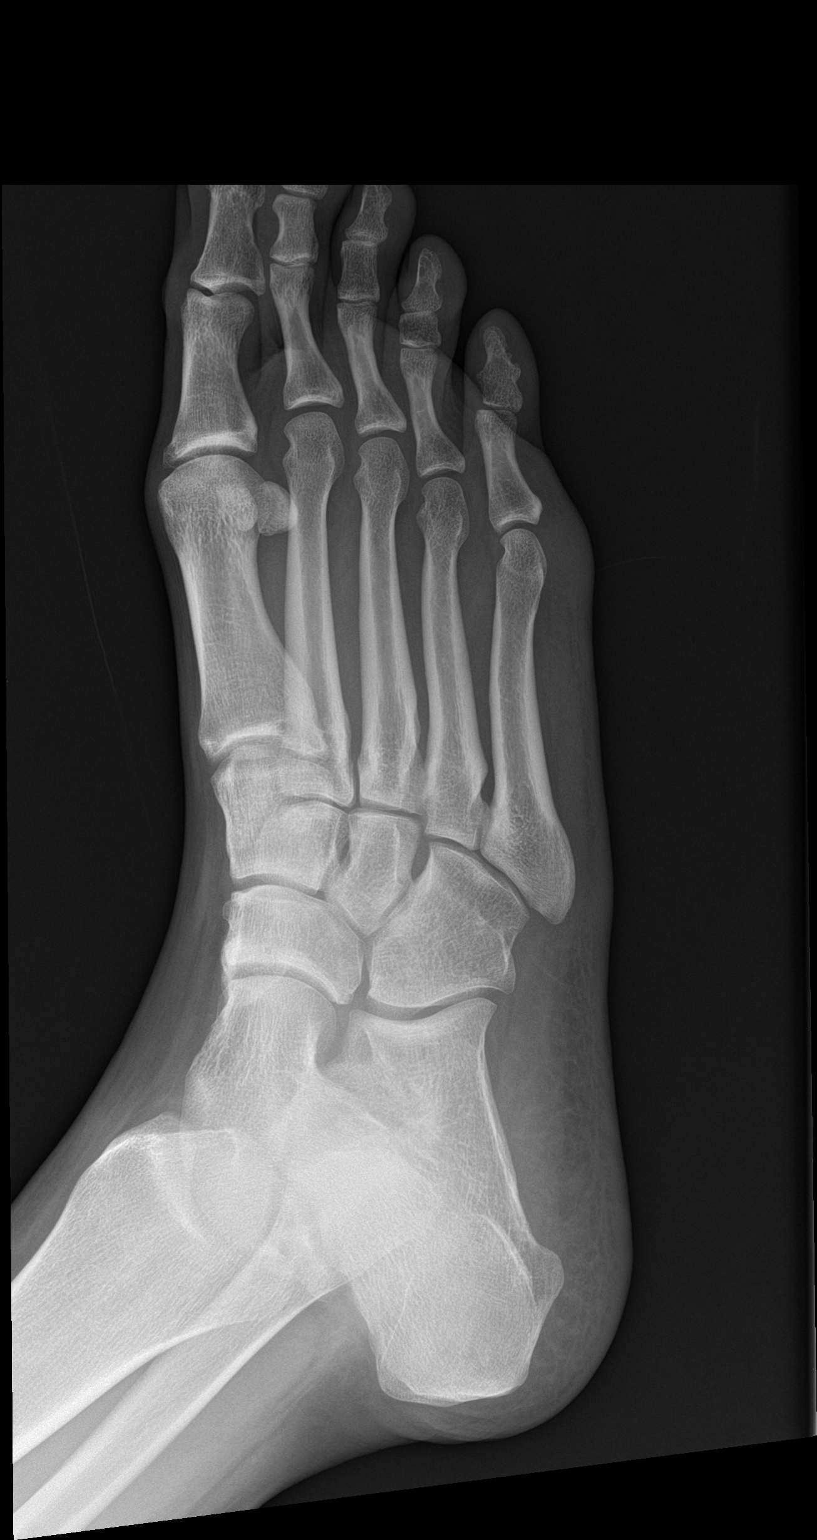

[foot lat]
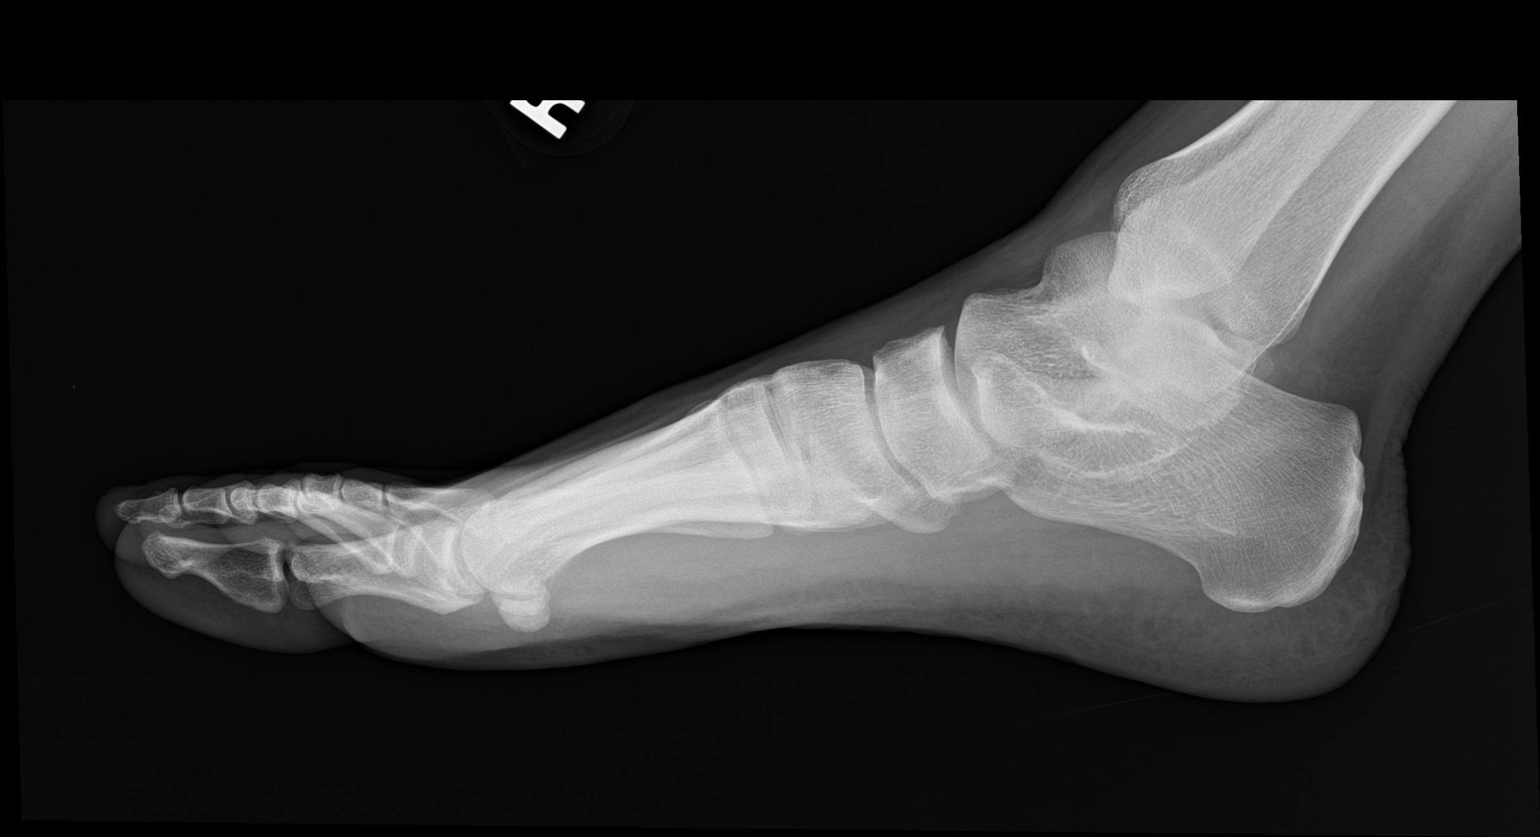

[3 of 3 positions shown; findings below may reference images not displayed]

FINDINGS: Frontal, oblique, and lateral views were obtained. There is no
fracture or dislocation. Joint spaces appear normal. No erosive
change.
IMPRESSION: No fracture or dislocation.  No evident arthropathy.

## 2020-05-25 ENCOUNTER — Telehealth: Payer: Self-pay | Admitting: General Practice

## 2020-05-25 NOTE — Telephone Encounter (Signed)
Individual has been contacted 3+ times regarding ED referral and has been given information regarding how to become a pt. No further contact will be established with individual.
